# Patient Record
Sex: Male | Born: 1970 | Race: White | Hispanic: No | Smoking: Current every day smoker
Health system: Southern US, Community
[De-identification: ages and names within clinical notes are randomized; demographics above are authoritative.]

## PROBLEM LIST (undated history)

## (undated) DIAGNOSIS — M1990 Unspecified osteoarthritis, unspecified site: Secondary | ICD-10-CM

## (undated) DIAGNOSIS — F419 Anxiety disorder, unspecified: Secondary | ICD-10-CM

## (undated) HISTORY — DX: Anxiety disorder, unspecified: F41.9

---

## 1999-07-05 ENCOUNTER — Emergency Department (HOSPITAL_COMMUNITY): Admission: EM | Admit: 1999-07-05 | Discharge: 1999-07-06 | Payer: Self-pay | Admitting: Emergency Medicine

## 2016-06-01 ENCOUNTER — Ambulatory Visit: Payer: Self-pay

## 2017-04-21 ENCOUNTER — Encounter (HOSPITAL_COMMUNITY): Payer: Self-pay | Admitting: Emergency Medicine

## 2017-04-21 ENCOUNTER — Ambulatory Visit (HOSPITAL_COMMUNITY)
Admission: EM | Admit: 2017-04-21 | Discharge: 2017-04-21 | Disposition: A | Discharge status: Home / Self Care | Payer: Self-pay | Attending: Internal Medicine | Admitting: Internal Medicine

## 2017-04-21 DIAGNOSIS — M130 Polyarthritis, unspecified: Secondary | ICD-10-CM

## 2017-04-21 DIAGNOSIS — M2550 Pain in unspecified joint: Secondary | ICD-10-CM | POA: Insufficient documentation

## 2017-04-21 DIAGNOSIS — F17210 Nicotine dependence, cigarettes, uncomplicated: Secondary | ICD-10-CM | POA: Insufficient documentation

## 2017-04-21 DIAGNOSIS — M1990 Unspecified osteoarthritis, unspecified site: Secondary | ICD-10-CM | POA: Insufficient documentation

## 2017-04-21 HISTORY — DX: Unspecified osteoarthritis, unspecified site: M19.90

## 2017-04-21 LAB — COMPREHENSIVE METABOLIC PANEL
ALT: 20 U/L (ref 17–63)
ANION GAP: 8 (ref 5–15)
AST: 23 U/L (ref 15–41)
Albumin: 4.3 g/dL (ref 3.5–5.0)
Alkaline Phosphatase: 80 U/L (ref 38–126)
BUN: 14 mg/dL (ref 6–20)
CALCIUM: 9.5 mg/dL (ref 8.9–10.3)
CHLORIDE: 98 mmol/L — AB (ref 101–111)
CO2: 28 mmol/L (ref 22–32)
CREATININE: 0.89 mg/dL (ref 0.61–1.24)
Glucose, Bld: 108 mg/dL — ABNORMAL HIGH (ref 65–99)
Potassium: 4 mmol/L (ref 3.5–5.1)
SODIUM: 134 mmol/L — AB (ref 135–145)
Total Bilirubin: 1 mg/dL (ref 0.3–1.2)
Total Protein: 7.9 g/dL (ref 6.5–8.1)

## 2017-04-21 LAB — CBC WITH DIFFERENTIAL/PLATELET
Basophils Absolute: 0 10*3/uL (ref 0.0–0.1)
Basophils Relative: 0 %
EOS ABS: 0.1 10*3/uL (ref 0.0–0.7)
EOS PCT: 1 %
HCT: 41.4 % (ref 39.0–52.0)
Hemoglobin: 14.2 g/dL (ref 13.0–17.0)
LYMPHS ABS: 2.2 10*3/uL (ref 0.7–4.0)
LYMPHS PCT: 26 %
MCH: 32.1 pg (ref 26.0–34.0)
MCHC: 34.3 g/dL (ref 30.0–36.0)
MCV: 93.7 fL (ref 78.0–100.0)
MONO ABS: 0.6 10*3/uL (ref 0.1–1.0)
MONOS PCT: 7 %
Neutro Abs: 5.5 10*3/uL (ref 1.7–7.7)
Neutrophils Relative %: 66 %
PLATELETS: 280 10*3/uL (ref 150–400)
RBC: 4.42 MIL/uL (ref 4.22–5.81)
RDW: 12.5 % (ref 11.5–15.5)
WBC: 8.4 10*3/uL (ref 4.0–10.5)

## 2017-04-21 LAB — SEDIMENTATION RATE: SED RATE: 21 mm/h — AB (ref 0–16)

## 2017-04-21 LAB — C-REACTIVE PROTEIN: CRP: 3.1 mg/dL — AB (ref <1.0)

## 2017-04-21 MED ORDER — METHYLPREDNISOLONE SODIUM SUCC 125 MG IJ SOLR
125.0000 mg | Freq: Once | INTRAMUSCULAR | Status: AC
  Administered 2017-04-21: 125 mg via INTRAMUSCULAR

## 2017-04-21 MED ORDER — PREDNISONE 10 MG PO TABS: 10.0000 mg | ORAL_TABLET | Freq: Every day | ORAL | 0 refills | Status: AC

## 2017-04-21 MED ORDER — METHYLPREDNISOLONE SODIUM SUCC 125 MG IJ SOLR
INTRAMUSCULAR | Status: AC
  Filled 2017-04-21: qty 2

## 2017-04-21 NOTE — ED Triage Notes (Signed)
Pt reports chronic joint pain since June.  Pt has been to several outpatient clinics for this problem. He has been prescribed several different medications for this and has had steroid shots that only last a day or two.  He had xrays done August 10 that shows arthritis in his right wrist and neck.

## 2017-04-21 NOTE — Discharge Instructions (Addendum)
Symmetric joint pain of recent onset primarily involving hips/low back and shoulder/neck suggests a possible rheumatologic problem. Possibilities include polymyalgia rheumatica and rheumatoid arthritis, among others.  Injection of Solu-Medrol (steroid) was given today, along with a prescription for low-dose prednisone. Labs were drawn to assess for some of the more common causes of inflammatory arthritis. A Lyme test is also pending. A primary care provider can help with further evaluation; it sometimes takes a long time to reach a diagnosis.

## 2017-04-21 NOTE — ED Provider Notes (Signed)
[facility]    CSN: [id number] Arrival date & time: 04/21/17  1132     History   Chief Complaint Chief Complaint  Patient presents with  . Joint Pain    HPI Larry Gardner is a 46 y.o. male. He presents today with onset in June of pain in the hips, which has gradually progressed to include pain in the low back, shoulders, and wrists. Some pain in the back of the neck, radiates to the jaw bilaterally. He has morning stiffness that lasts for about 4 hours. Some improvement with taking Naprosyn 500 mg. He is worried about Lyme disease. He was evaluated at [facility] with some lab work, and reports that they found him to be anemic. Family history of possible rheumatoid in the great grandmother, who had hand distortion from arthritis. No rash, no fever, no GI upset. Had always been well previous to the onset of symptoms in June. Currently not able to work, and very frustrated about this.    HPI  Past Medical History:  Diagnosis Date  . Arthritis     History reviewed. No pertinent surgical history.     Home Medications    Prior to Admission medications   Medication Sig Start Date End Date Taking? Authorizing Provider  cyclobenzaprine (FLEXERIL) 10 MG tablet Take 10 mg by mouth 3 (three) times daily as needed for muscle spasms.   Yes [provider]  ibuprofen (ADVIL,MOTRIN) 800 MG tablet Take 800 mg by mouth every 8 (eight) hours as needed.   Yes [provider]  naproxen (NAPROSYN) 500 MG tablet Take 500 mg by mouth 2 (two) times daily with a meal.   Yes [provider]  meloxicam (MOBIC) 15 MG tablet Take 15 mg by mouth daily.    [provider]  predniSONE (DELTASONE) 10 MG tablet Take 1 tablet (10 mg total) by mouth daily with breakfast. 04/21/17 05/05/17  Larry Moore, MD    Family History History reviewed. No pertinent family history.  Social History Social History  Substance Use Topics  . Smoking status:  Current Every Day Smoker    Packs/day: 0.50    Types: Cigarettes  . Smokeless tobacco: Never Used  . Alcohol use 3.6 oz/week    6 Cans of beer per week     Allergies   Patient has no known allergies.   Review of Systems Review of Systems  All other systems reviewed and are negative.    Physical Exam Triage Vital Signs ED Triage Vitals  Enc Vitals Group     BP 04/21/17 1311 (!) 154/91     Pulse Rate 04/21/17 1311 79     Resp --      Temp 04/21/17 1311 98.7 F (37.1 C)     Temp Source 04/21/17 1311 Oral     SpO2 04/21/17 1311 100 %     Weight --      Height --      Pain Score 04/21/17 1312 8     Pain Loc --    Updated Vital Signs BP (!) 154/91 (BP Location: Left Arm)   Pulse 79   Temp 98.7 F (37.1 C) (Oral)   SpO2 100%   Physical Exam  Constitutional: He is oriented to person, place, and time. No distress.  Alert, nicely groomed  HENT:  Head: Atraumatic.  Eyes:  Conjugate gaze, no eye redness/drainage  Neck: Neck supple.  Cardiovascular: Normal rate and regular rhythm.   Pulmonary/Chest: No respiratory distress. He  has no wheezes. He has no rales.  Lungs clear, symmetric breath sounds  Abdominal: He exhibits no distension.  Musculoskeletal: Normal range of motion.  Pain limiting movement in the shoulders and hips, difficult to rise from chair, but is able to rise and walk slowly to the exam table. Some difficulty climbing onto the exam table. Not able to raise arms fully overhead. Mild symmetric swelling of both wrists.  Neurological: He is alert and oriented to person, place, and time.  Skin: Skin is warm and dry.  No cyanosis  Nursing note and vitals reviewed.    UC Treatments / Results  Labs Results for orders placed or performed during the hospital encounter of 04/21/17  CBC with Differential  Result Value Ref Range   WBC 8.4 4.0 - 10.5 K/uL   RBC 4.42 4.22 - 5.81 MIL/uL   Hemoglobin 14.2 13.0 - 17.0 g/dL   HCT 14.7 82.9 - 56.2 %   MCV 93.7  78.0 - 100.0 fL   MCH 32.1 26.0 - 34.0 pg   MCHC 34.3 30.0 - 36.0 g/dL   RDW 13.0 86.5 - 78.4 %   Platelets 280 150 - 400 K/uL   Neutrophils Relative % 66 %   Neutro Abs 5.5 1.7 - 7.7 K/uL   Lymphocytes Relative 26 %   Lymphs Abs 2.2 0.7 - 4.0 K/uL   Monocytes Relative 7 %   Monocytes Absolute 0.6 0.1 - 1.0 K/uL   Eosinophils Relative 1 %   Eosinophils Absolute 0.1 0.0 - 0.7 K/uL   Basophils Relative 0 %   Basophils Absolute 0.0 0.0 - 0.1 K/uL  Comprehensive metabolic panel  Result Value Ref Range   Sodium 134 (L) 135 - 145 mmol/L   Potassium 4.0 3.5 - 5.1 mmol/L   Chloride 98 (L) 101 - 111 mmol/L   CO2 28 22 - 32 mmol/L   Glucose, Bld 108 (H) 65 - 99 mg/dL   BUN 14 6 - 20 mg/dL   Creatinine, Ser 6.96 0.61 - 1.24 mg/dL   Calcium 9.5 8.9 - 29.5 mg/dL   Total Protein 7.9 6.5 - 8.1 g/dL   Albumin 4.3 3.5 - 5.0 g/dL   AST 23 15 - 41 U/L   ALT 20 17 - 63 U/L   Alkaline Phosphatase 80 38 - 126 U/L   Total Bilirubin 1.0 0.3 - 1.2 mg/dL   GFR calc non Af Amer >60 >60 mL/min   GFR calc Af Amer >60 >60 mL/min   Anion gap 8 5 - 15  Sedimentation rate  Result Value Ref Range   Sed Rate 21 (H) 0 - 16 mm/hr  C-reactive protein  Result Value Ref Range   CRP 3.1 (H) <1.0 mg/dL    EKG  EKG Interpretation None       Procedures Procedures (including critical care time)  Medications Ordered in UC Medications  methylPREDNISolone sodium succinate (SOLU-MEDROL) 125 mg/2 mL injection 125 mg (125 mg Intramuscular Given 04/21/17 1411)     Final Clinical Impressions(s) / UC Diagnoses   Final diagnoses:  Polyarthropathy or polyarthritis of multiple sites   Symmetric joint pain of recent onset primarily involving hips/low back and shoulder/neck suggests a possible rheumatologic problem. Possibilities include polymyalgia rheumatica and rheumatoid arthritis, among others.  Injection of Solu-Medrol (steroid) was given today, along with a prescription for low-dose prednisone. Labs were  drawn to assess for some of the more common causes of inflammatory arthritis. A Lyme test is also pending. A primary care provider  can help with further evaluation; it sometimes takes a long time to reach a diagnosis.   New Prescriptions New Prescriptions   PREDNISONE (DELTASONE) 10 MG TABLET    Take 1 tablet (10 mg total) by mouth daily with breakfast.     Controlled Substance Prescriptions [place] Controlled Substance Registry consulted? No   Larry Moore, MD 04/21/17 2102

## 2017-04-22 LAB — RHEUMATOID FACTOR

## 2017-04-22 LAB — B. BURGDORFI ANTIBODIES: B burgdorferi Ab IgG+IgM: <0.91 {ISR} (ref 0.00–0.90)

## 2017-04-24 LAB — ANTINUCLEAR ANTIBODIES, IFA: ANTINUCLEAR ANTIBODIES, IFA: NEGATIVE

## 2017-08-15 ENCOUNTER — Ambulatory Visit: Payer: Self-pay | Admitting: Internal Medicine

## 2017-08-17 ENCOUNTER — Encounter: Payer: Self-pay | Admitting: Internal Medicine

## 2017-08-17 ENCOUNTER — Ambulatory Visit: Payer: Self-pay | Attending: Internal Medicine | Admitting: Internal Medicine

## 2017-08-17 VITALS — BP 133/85 | HR 66 | Temp 98.0°F | Resp 16 | Ht 68.0 in | Wt 177.6 lb

## 2017-08-17 DIAGNOSIS — F17210 Nicotine dependence, cigarettes, uncomplicated: Secondary | ICD-10-CM | POA: Insufficient documentation

## 2017-08-17 DIAGNOSIS — M2550 Pain in unspecified joint: Secondary | ICD-10-CM

## 2017-08-17 DIAGNOSIS — F419 Anxiety disorder, unspecified: Secondary | ICD-10-CM

## 2017-08-17 DIAGNOSIS — Z79899 Other long term (current) drug therapy: Secondary | ICD-10-CM | POA: Insufficient documentation

## 2017-08-17 DIAGNOSIS — F17200 Nicotine dependence, unspecified, uncomplicated: Secondary | ICD-10-CM

## 2017-08-17 DIAGNOSIS — Z8249 Family history of ischemic heart disease and other diseases of the circulatory system: Secondary | ICD-10-CM | POA: Insufficient documentation

## 2017-08-17 DIAGNOSIS — Z131 Encounter for screening for diabetes mellitus: Secondary | ICD-10-CM

## 2017-08-17 MED ORDER — DULOXETINE HCL 30 MG PO CPEP: 30.0000 mg | ORAL_CAPSULE | Freq: Every day | ORAL | 3 refills | Status: DC

## 2017-08-17 MED ORDER — MELOXICAM 15 MG PO TABS: 15.0000 mg | ORAL_TABLET | Freq: Every day | ORAL | 3 refills | Status: DC

## 2017-08-17 NOTE — Patient Instructions (Signed)
Please give patient for for [employer] and [employer]	[facility].   Please sign a release for use to get [facility].   Take Mobic and Cymbalta as discussed.  Stop Ibuprofen and Aleve.
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
## 2017-08-19 LAB — TSH: TSH: 2.36 u[IU]/mL (ref 0.450–4.500)

## 2017-08-19 LAB — ALDOLASE: Aldolase: 4.2 U/L (ref 3.3–10.3)

## 2017-08-19 LAB — CYCLIC CITRUL PEPTIDE ANTIBODY, IGG/IGA: Cyclic Citrullin Peptide Ab: 6 units (ref 0–19)

## 2017-08-19 LAB — HEMOGLOBIN A1C
Est. average glucose Bld gHb Est-mCnc: 120 mg/dL
HEMOGLOBIN A1C: 5.8 % — AB (ref 4.8–5.6)

## 2017-08-19 LAB — RHEUMATOID FACTOR: Rhuematoid fact SerPl-aCnc: <10 IU/mL (ref 0.0–13.9)

## 2017-08-19 LAB — CK: Total CK: 62 U/L (ref 24–204)

## 2017-08-22 ENCOUNTER — Telehealth: Payer: Self-pay

## 2017-08-22 NOTE — Telephone Encounter (Signed)
Contacted pt to go over lab results pt is aware and doesn't have any questions or concerns 

## 2017-08-30 ENCOUNTER — Ambulatory Visit: Payer: Self-pay

## 2017-09-28 ENCOUNTER — Ambulatory Visit: Payer: Self-pay | Admitting: Internal Medicine

## 2017-10-24 DIAGNOSIS — M2550 Pain in unspecified joint: Secondary | ICD-10-CM

## 2020-06-30 ENCOUNTER — Encounter (HOSPITAL_COMMUNITY): Payer: Self-pay

## 2020-06-30 ENCOUNTER — Emergency Department (HOSPITAL_COMMUNITY): Payer: Self-pay

## 2020-06-30 ENCOUNTER — Other Ambulatory Visit: Payer: Self-pay

## 2020-06-30 ENCOUNTER — Emergency Department (HOSPITAL_COMMUNITY)
Admission: EM | Admit: 2020-06-30 | Discharge: 2020-07-01 | Disposition: A | Discharge status: Home / Self Care | Payer: Self-pay | Attending: Emergency Medicine | Admitting: Emergency Medicine

## 2020-06-30 DIAGNOSIS — R0789 Other chest pain: Secondary | ICD-10-CM | POA: Insufficient documentation

## 2020-06-30 DIAGNOSIS — R079 Chest pain, unspecified: Secondary | ICD-10-CM

## 2020-06-30 DIAGNOSIS — R1013 Epigastric pain: Secondary | ICD-10-CM | POA: Insufficient documentation

## 2020-06-30 DIAGNOSIS — R059 Cough, unspecified: Secondary | ICD-10-CM | POA: Insufficient documentation

## 2020-06-30 DIAGNOSIS — F17210 Nicotine dependence, cigarettes, uncomplicated: Secondary | ICD-10-CM | POA: Insufficient documentation

## 2020-06-30 LAB — CBC
HCT: 45.1 % (ref 39.0–52.0)
Hemoglobin: 15.8 g/dL (ref 13.0–17.0)
MCH: 33.8 pg (ref 26.0–34.0)
MCHC: 35 g/dL (ref 30.0–36.0)
MCV: 96.6 fL (ref 80.0–100.0)
Platelets: 184 10*3/uL (ref 150–400)
RBC: 4.67 MIL/uL (ref 4.22–5.81)
RDW: 12 % (ref 11.5–15.5)
WBC: 6.7 10*3/uL (ref 4.0–10.5)
nRBC: 0 % (ref 0.0–0.2)

## 2020-06-30 LAB — BASIC METABOLIC PANEL
Anion gap: 9 (ref 5–15)
BUN: 17 mg/dL (ref 6–20)
CO2: 29 mmol/L (ref 22–32)
Calcium: 9.2 mg/dL (ref 8.9–10.3)
Chloride: 99 mmol/L (ref 98–111)
Creatinine, Ser: 0.92 mg/dL (ref 0.61–1.24)
GFR, Estimated: >60 mL/min (ref >60)
Glucose, Bld: 116 mg/dL — ABNORMAL HIGH (ref 70–99)
Potassium: 4.3 mmol/L (ref 3.5–5.1)
Sodium: 137 mmol/L (ref 135–145)

## 2020-06-30 LAB — HEPATIC FUNCTION PANEL
ALT: 34 U/L (ref 0–44)
AST: 27 U/L (ref 15–41)
Albumin: 4.6 g/dL (ref 3.5–5.0)
Alkaline Phosphatase: 76 U/L (ref 38–126)
Bilirubin, Direct: <0.1 mg/dL (ref 0.0–0.2)
Total Bilirubin: 0.5 mg/dL (ref 0.3–1.2)
Total Protein: 7.6 g/dL (ref 6.5–8.1)

## 2020-06-30 LAB — LIPASE, BLOOD: Lipase: 30 U/L (ref 11–51)

## 2020-06-30 LAB — TROPONIN I (HIGH SENSITIVITY): Troponin I (High Sensitivity): 4 ng/L (ref <18)

## 2020-06-30 MED ORDER — LIDOCAINE VISCOUS HCL 2 % MT SOLN
15.0000 mL | Freq: Once | OROMUCOSAL | Status: AC
  Administered 2020-07-01: 15 mL via ORAL
  Filled 2020-06-30: qty 15

## 2020-06-30 MED ORDER — ALUM & MAG HYDROXIDE-SIMETH 200-200-20 MG/5ML PO SUSP
30.0000 mL | Freq: Once | ORAL | Status: AC
  Administered 2020-07-01: 30 mL via ORAL
  Filled 2020-06-30: qty 30

## 2020-06-30 MED ORDER — MORPHINE SULFATE (PF) 4 MG/ML IV SOLN
4.0000 mg | Freq: Once | INTRAVENOUS | Status: AC
  Administered 2020-06-30: 4 mg via INTRAVENOUS
  Filled 2020-06-30: qty 1

## 2020-06-30 MED ORDER — IOHEXOL 350 MG/ML SOLN
100.0000 mL | Freq: Once | INTRAVENOUS | Status: AC | PRN
  Administered 2020-06-30: 100 mL via INTRAVENOUS

## 2020-06-30 MED ORDER — ONDANSETRON HCL 4 MG/2ML IJ SOLN
4.0000 mg | Freq: Once | INTRAMUSCULAR | Status: AC
  Administered 2020-06-30: 4 mg via INTRAVENOUS
  Filled 2020-06-30: qty 2

## 2020-06-30 MED ORDER — PANTOPRAZOLE SODIUM 40 MG IV SOLR
40.0000 mg | Freq: Once | INTRAVENOUS | Status: AC
  Administered 2020-07-01: 40 mg via INTRAVENOUS
  Filled 2020-06-30: qty 40

## 2020-06-30 NOTE — ED Triage Notes (Signed)
Patient arrives with complaints of central chest pain that started about 30 minutes ago. Reporting one episode of vomiting. Declines any dizziness.

## 2020-06-30 NOTE — ED Provider Notes (Signed)
[facility]-EMERGENCY DEPT Provider Note   CSN: [id number] Arrival date & time: 06/30/20  2133     History Chief Complaint  Patient presents with  . Chest Pain    Larry Gardner is a 49 y.o. male.  Larry Gardner is a 49 y.o. male patient with history of polymyalgia rheumatica, arthritis, anxiety, tobacco abuse, who presents to the emergency department for evaluation of chest pain.  He states that this chest pain began suddenly about 30 minutes prior to arrival.  He was sitting at his house eating some canned tuna, he took a small bite of this and then had sudden onset of this pain in the lower center of his chest.  He has a hard time describing the pain says it is a constant aching pain he says sometimes it feels sharp and other times it feels like pressure.  Pain does not seem to be radiating, but he does state that it feels like he has some pain in his upper abdomen as well.  No pain in the arm neck or jaw.  He has never had pain like this, and pain has not gone away, he states that sometimes it seems to ease off but never completely resolves.  He denies associated shortness of breath.  He states when it first occurred he became diaphoretic.  He went outside and was coughing after the pain started and states he spit up a little phlegm but otherwise has not been coughing.  Denies vomiting.  No lower extremity pain or swelling.  No prior history of heart problems, high blood pressure, diabetes.  Patient does have a family history of hypertension and hyperlipidemia.  He currently smokes about half pack per day.  Initially patient denied drug use but later states that he does intermittently use cocaine which he most recently used on Saturday but he does not feel that this could be in any way related.  Reports he drinks intermittently and did have 2 glasses of wine earlier today.        Past Medical History:  Diagnosis Date  . Anxiety   . Arthritis     Patient  Active Problem List   Diagnosis Date Noted  . Polyarthralgia 08/17/2017  . Anxiety disorder 08/17/2017  . Tobacco use disorder 08/17/2017    History reviewed. No pertinent surgical history.     Family History  Problem Relation Age of Onset  . Hyperlipidemia Mother   . Hypertension Father   . Hyperlipidemia Father     Social History   Tobacco Use  . Smoking status: Current Every Day Smoker    Packs/day: 0.25    Types: Cigarettes  . Smokeless tobacco: Never Used  Substance Use Topics  . Alcohol use: Yes    Alcohol/week: 6.0 standard drinks    Types: 6 Cans of beer per week  . Drug use: No    Home Medications Prior to Admission medications   Medication Sig Start Date End Date Taking? Authorizing Provider  ibuprofen (ADVIL) 200 MG tablet Take 200-400 mg by mouth every 6 (six) hours as needed for moderate pain.   Yes [provider]  DULoxetine (CYMBALTA) 30 MG capsule Take 1 capsule (30 mg total) by mouth daily. Patient not taking: Reported on 06/30/2020 08/17/17   Marcine Matar, MD  meloxicam (MOBIC) 15 MG tablet Take 1 tablet (15 mg total) by mouth daily. Patient not taking: Reported on 06/30/2020 08/17/17   Marcine Matar, MD  omeprazole (PRILOSEC) 40 MG capsule  Take 1 capsule (40 mg total) by mouth 2 (two) times daily before a meal. 07/01/20   Dartha Lodge, PA-C  sucralfate (CARAFATE) 1 GM/10ML suspension Take 10 mLs (1 g total) by mouth 4 (four) times daily -  with meals and at bedtime. 07/01/20   Dartha Lodge, PA-C    Allergies    Patient has no known allergies.  Review of Systems   Review of Systems  Constitutional: Positive for diaphoresis. Negative for chills and fever.  HENT: Negative.   Respiratory: Negative for cough and shortness of breath.   Cardiovascular: Positive for chest pain. Negative for palpitations and leg swelling.  Gastrointestinal: Negative for abdominal distention, abdominal pain, diarrhea, nausea and vomiting.    Genitourinary: Negative for dysuria and frequency.  Musculoskeletal: Negative for arthralgias, back pain and joint swelling.  Skin: Negative for color change and rash.  Neurological: Negative for dizziness, syncope, weakness, light-headedness, numbness and headaches.  All other systems reviewed and are negative.   Physical Exam Updated Vital Signs BP (!) 141/103 (BP Location: Right Arm)   Pulse 86   Temp 98 F (36.7 C) (Oral)   Resp 17   SpO2 98%   Physical Exam Vitals and nursing note reviewed.  Constitutional:      General: He is not in acute distress.    Appearance: He is well-developed. He is not diaphoretic.     Comments: Patient appears uncomfortable, but is in no acute distress  HENT:     Head: Normocephalic and atraumatic.  Eyes:     General:        Right eye: No discharge.        Left eye: No discharge.  Cardiovascular:     Rate and Rhythm: Normal rate and regular rhythm.     Pulses:          Radial pulses are 2+ on the right side and 2+ on the left side.       Dorsalis pedis pulses are 2+ on the right side and 2+ on the left side.     Heart sounds: Normal heart sounds. No murmur heard.  No friction rub. No gallop.   Pulmonary:     Effort: Pulmonary effort is normal. No respiratory distress.     Breath sounds: Normal breath sounds. No wheezing or rales.     Comments: Respirations equal and unlabored, patient able to speak in full sentences, lungs clear to auscultation bilaterally Chest:     Chest wall: No tenderness.     Comments: Pain is not reproducible with palpation Abdominal:     General: Bowel sounds are normal. There is no distension.     Palpations: Abdomen is soft. There is no mass.     Tenderness: There is no abdominal tenderness. There is no guarding.     Comments: Abdomen is soft, nondistended, bowel sounds present throughout, patient does endorse some pain in the epigastric region coming from the lower chest but this is not made worse with  palpation, all other quadrants nontender to palpation, no guarding or rebound tenderness  Musculoskeletal:        General: No deformity.     Cervical back: Neck supple.     Right lower leg: No tenderness. No edema.     Left lower leg: No tenderness. No edema.  Skin:    General: Skin is warm and dry.     Capillary Refill: Capillary refill takes less than 2 seconds.  Neurological:     Mental  Status: He is alert.     Coordination: Coordination normal.     Comments: Speech is clear, able to follow commands Moves extremities without ataxia, coordination intact  Psychiatric:        Mood and Affect: Mood normal.        Behavior: Behavior normal.     ED Results / Procedures / Treatments   Labs (all labs ordered are listed, but only abnormal results are displayed) Labs Reviewed  BASIC METABOLIC PANEL - Abnormal; Notable for the following components:      Result Value   Glucose, Bld 116 (*)    All other components within normal limits  RAPID URINE DRUG SCREEN, HOSP PERFORMED - Abnormal; Notable for the following components:   Cocaine POSITIVE (*)    All other components within normal limits  CBC  HEPATIC FUNCTION PANEL  LIPASE, BLOOD  TROPONIN I (HIGH SENSITIVITY)  TROPONIN I (HIGH SENSITIVITY)    EKG EKG Interpretation  Date/Time:  Tuesday June 30 2020 21:49:06 EST Ventricular Rate:  91 PR Interval:    QRS Duration: 102 QT Interval:  367 QTC Calculation: 452 R Axis:   71 Text Interpretation: Sinus rhythm Biatrial enlargement Probable anterolateral infarct, old Baseline wander in lead(s) V2 Confirmed by Tilden Fossa [phone]) on 06/30/2020 10:27:26 PM   Radiology DG Chest 2 View  Result Date: 06/30/2020 CLINICAL DATA:  Central chest pain for 30 minutes EXAM: CHEST - 2 VIEW COMPARISON:  None. FINDINGS: The heart size and mediastinal contours are within normal limits. Both lungs are clear. The visualized skeletal structures are unremarkable. IMPRESSION: No active  cardiopulmonary disease. Electronically Signed   By: Sharlet Salina M.D.   On: 06/30/2020 22:05   CT Angio Chest/Abd/Pel for Dissection W and/or Wo Contrast  Result Date: 06/30/2020 CLINICAL DATA:  Central chest pain for 30 minutes, vomiting EXAM: CT ANGIOGRAPHY CHEST, ABDOMEN AND PELVIS TECHNIQUE: Non-contrast CT of the chest was initially obtained. Multidetector CT imaging through the chest, abdomen and pelvis was performed using the standard protocol during bolus administration of intravenous contrast. Multiplanar reconstructed images and MIPs were obtained and reviewed to evaluate the vascular anatomy. CONTRAST:  OMNIPAQUE IOHEXOL 350 MG/ML SOLN COMPARISON:  None. FINDINGS: CTA CHEST FINDINGS Cardiovascular: Thoracic aorta is unremarkable without aneurysm or dissection. The heart is unremarkable without pericardial effusion. Minimal atherosclerosis within the coronary vasculature. While not optimized for opacification of the pulmonary vasculature, there is sufficient contrast enhancement to exclude pulmonary emboli. Mediastinum/Nodes: No enlarged mediastinal, hilar, or axillary lymph nodes. Thyroid gland, trachea, and esophagus demonstrate no significant findings. Lungs/Pleura: Mild upper lobe predominant emphysema. No airspace disease, effusion, or pneumothorax. Musculoskeletal: No acute or destructive bony lesions. Reconstructed images demonstrate no additional findings. Review of the MIP images confirms the above findings. CTA ABDOMEN AND PELVIS FINDINGS VASCULAR Aorta: Normal caliber aorta without aneurysm, dissection, vasculitis or significant stenosis. Scattered atherosclerosis of the distal aorta. Celiac: Patent without evidence of aneurysm, dissection, vasculitis or significant stenosis. SMA: Patent without evidence of aneurysm, dissection, vasculitis or significant stenosis. Renals: Both renal arteries are patent without evidence of aneurysm, dissection, vasculitis, fibromuscular dysplasia or  significant stenosis. There are bilateral accessory renal arteries, supplying the upper pole the right kidney in the lower pole the left kidney. These vessels are also widely patent. IMA: Patent without evidence of aneurysm, dissection, vasculitis or significant stenosis. Inflow: Patent without evidence of aneurysm, dissection, vasculitis or significant stenosis. Veins: No obvious venous abnormality within the limitations of this arterial phase study. Review of  the MIP images confirms the above findings. NON-VASCULAR Hepatobiliary: There is diffuse hepatic steatosis. Gallbladder is decompressed but otherwise unremarkable. Pancreas: Unremarkable. No pancreatic ductal dilatation or surrounding inflammatory changes. Spleen: Normal in size without focal abnormality. Adrenals/Urinary Tract: Adrenal glands are unremarkable. Kidneys are normal, without renal calculi, focal lesion, or hydronephrosis. Bladder is unremarkable. Stomach/Bowel: No bowel obstruction or ileus. No wall thickening or inflammatory change. Normal appendix right lower quadrant. Lymphatic: No pathologic adenopathy. Reproductive: Prostate is unremarkable. Other: No free fluid or free gas. No abdominal wall hernia. Musculoskeletal: No acute or destructive bony lesions. Reconstructed images demonstrate no additional findings. Review of the MIP images confirms the above findings. IMPRESSION: 1. No evidence of thoracic or abdominal aortic aneurysm or dissection. 2. No evidence of pulmonary embolism. 3. Diffuse hepatic steatosis. 4. Aortic Atherosclerosis (ICD10-I70.0) and Emphysema (ICD10-J43.9). Electronically Signed   By: Sharlet Salina M.D.   On: 06/30/2020 23:27    Procedures Procedures (including critical care time)  Medications Ordered in ED Medications  sucralfate (CARAFATE) 1 GM/10ML suspension 1 g (has no administration in time range)  ondansetron (ZOFRAN) injection 4 mg (4 mg Intravenous Given 06/30/20 2328)  morphine 4 MG/ML injection 4  mg (4 mg Intravenous Given 06/30/20 2329)  iohexol (OMNIPAQUE) 350 MG/ML injection 100 mL (100 mLs Intravenous Contrast Given 06/30/20 2304)  pantoprazole (PROTONIX) injection 40 mg (40 mg Intravenous Given 07/01/20 0033)  alum & mag hydroxide-simeth (MAALOX/MYLANTA) 200-200-20 MG/5ML suspension 30 mL (30 mLs Oral Given 07/01/20 0034)    And  lidocaine (XYLOCAINE) 2 % viscous mouth solution 15 mL (15 mLs Oral Given 07/01/20 0034)    ED Course  I have reviewed the triage vital signs and the nursing notes.  Pertinent labs & imaging results that were available during my care of the patient were reviewed by me and considered in my medical decision making (see chart for details).    MDM Rules/Calculators/A&P                          Patient presents to the emergency department with severe sudden onset pain to the central lower chest, with some pain in the epigastric region as well. Patient nontoxic appearing, in no apparent distress, vitals without significant abnormality.  Pain is not reproducible with palpation on exam.  Patient does report alcohol use earlier today and history of cocaine use most recently on Saturday.  No prior cardiac history.   DDX: ACS, pulmonary embolism, dissection, pneumothorax, pneumonia, arrhythmia, severe anemia, MSK, GERD, pancreatitis, cholecystitis, anxiety. Evaluation initiated with labs, EKG, and CXR. Patient on cardiac monitor.   CBC: No leukocytosis, normal hemoglobin BMP: Glucose of 116 but no other electrolyte derangements, normal renal function LFTs: WNL Lipase: WNL Troponin: Negative x2 EKG: Sinus rhythm without acute ischemic changes. CXR:  Negative, without infiltrate, effusion, pneumothorax, or fracture/dislocation.   Given patient's sudden onset of pain that is severe causing some diaphoresis, and that pain has been unrelenting, history of cocaine abuse puts him at risk for aortic dissection, will get dissection study.  Fortunately patient's CT  shows no evidence of dissection or aortic aneurysm, no evidence of PE, some diffuse hepatic steatosis noted but no other acute abnormalities noted within the chest, abdomen or pelvis.  No signs of cholecystitis or pancreatic inflammation.  Patient has had reassuring work-up and feel that symptoms are very atypical for ACS.  High clinical suspicion for GERD, gastritis or gastric ulcer.  He did not get much improvement with  morphine and Zofran given for pain, but was given GI cocktail and IV Protonix and did get relief.  Will start patient on twice daily omeprazole and prescribed Carafate, he will need to follow-up with GI, may need endoscopy.  Is not having any melena or hematochezia and has not been vomiting.  At this time there does not appear to be any evidence of an acute emergency medical condition and the patient appears stable for discharge with appropriate outpatient follow up.Diagnosis was discussed with patient who verbalizes understanding and is agreeable to discharge.   Final Clinical Impression(s) / ED Diagnoses Final diagnoses:  Central chest pain  Epigastric pain    Rx / DC Orders ED Discharge Orders         Ordered    sucralfate (CARAFATE) 1 GM/10ML suspension  3 times daily with meals & bedtime        07/01/20 0142    omeprazole (PRILOSEC) 40 MG capsule  2 times daily before meals        07/01/20 0142           Dartha LodgeFord, Riad Wagley N, PA-C 07/01/20 0150    Tilden Fossaees, Elizabeth, MD 07/01/20 [id number]

## 2020-07-01 LAB — RAPID URINE DRUG SCREEN, HOSP PERFORMED
Amphetamines: NOT DETECTED
Barbiturates: NOT DETECTED
Benzodiazepines: NOT DETECTED
Cocaine: POSITIVE — AB
Opiates: NOT DETECTED
Tetrahydrocannabinol: NOT DETECTED

## 2020-07-01 LAB — TROPONIN I (HIGH SENSITIVITY): Troponin I (High Sensitivity): <2 ng/L (ref <18)

## 2020-07-01 MED ORDER — SUCRALFATE 1 GM/10ML PO SUSP
1.0000 g | Freq: Once | ORAL | Status: AC
  Administered 2020-07-01: 1 g via ORAL
  Filled 2020-07-01: qty 10

## 2020-07-01 MED ORDER — SUCRALFATE 1 GM/10ML PO SUSP: 1.0000 g | Freq: Three times a day (TID) | ORAL | 0 refills | Status: DC

## 2020-07-01 MED ORDER — OMEPRAZOLE 40 MG PO CPDR: 40.0000 mg | DELAYED_RELEASE_CAPSULE | Freq: Two times a day (BID) | ORAL | 0 refills | Status: DC

## 2020-07-01 NOTE — Discharge Instructions (Addendum)
Your evaluation today has been reassuring, does not suggest a problem with your heart or lungs.  Suspect pain may be due to gastritis, and ulcer or acid reflux.  Please begin taking omeprazole twice daily before breakfast and dinner, use Carafate before meals and at bedtime to help coat and protect your stomach.  You can use Maalox for breakthrough symptoms.  Avoid alcohol, or NSAIDs such as aspirin, ibuprofen or Aleve.  Use the information provided on food changes to help reduce symptoms.  I would like for you to call to schedule follow-up with GI so that they can further evaluate this pain.  If you develop worsening pain, fevers, vomiting, blood in your vomit or stool or any other new or concerning symptoms return for reevaluation.

## 2021-09-08 IMAGING — CR DG CHEST 2V
2 series · 2 of 2 positions shown · non-contrast
Comparison: None.

CLINICAL DATA: Central chest pain for 30 minutes

EXAM:
CHEST - 2 VIEW

[w chest pa]
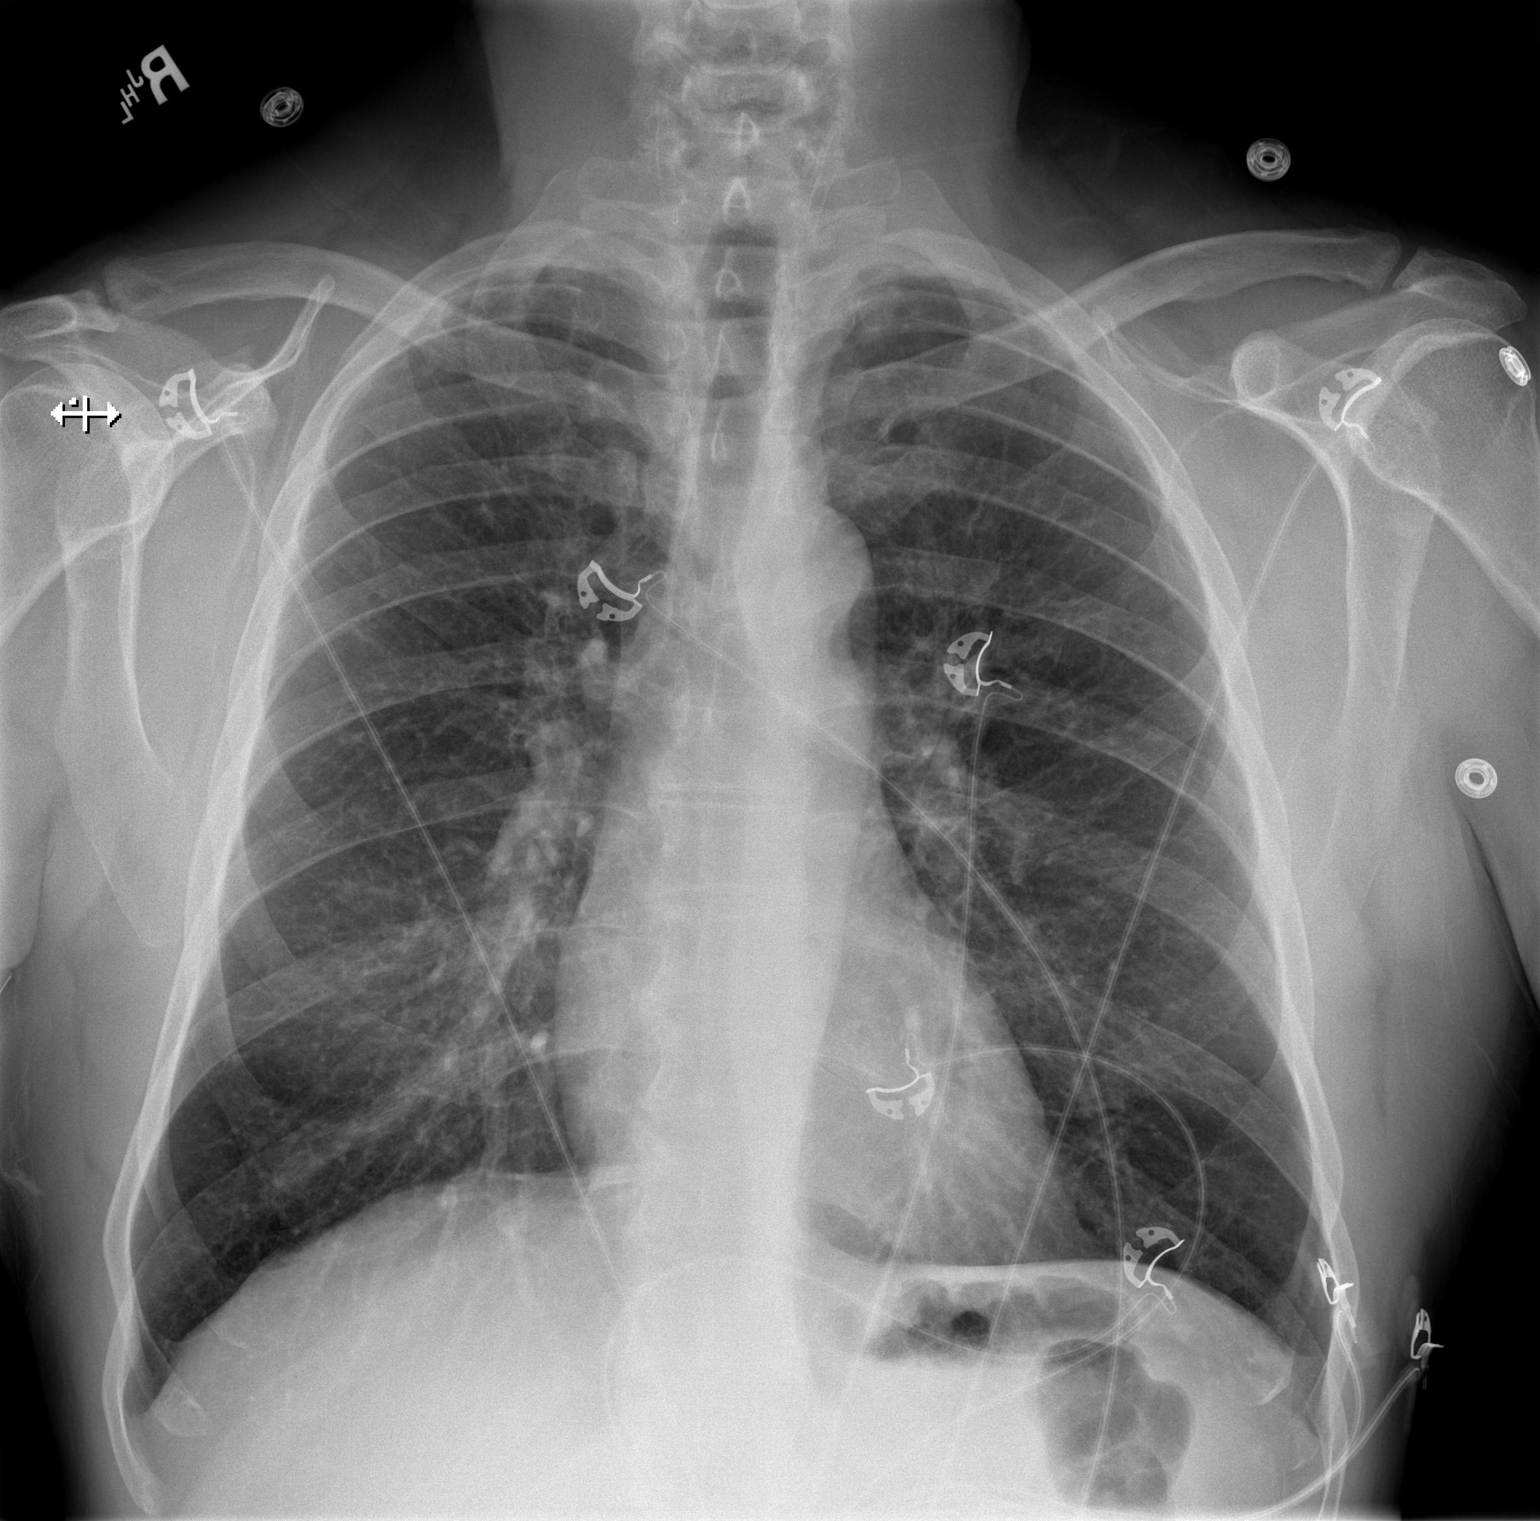

[w chest lat]
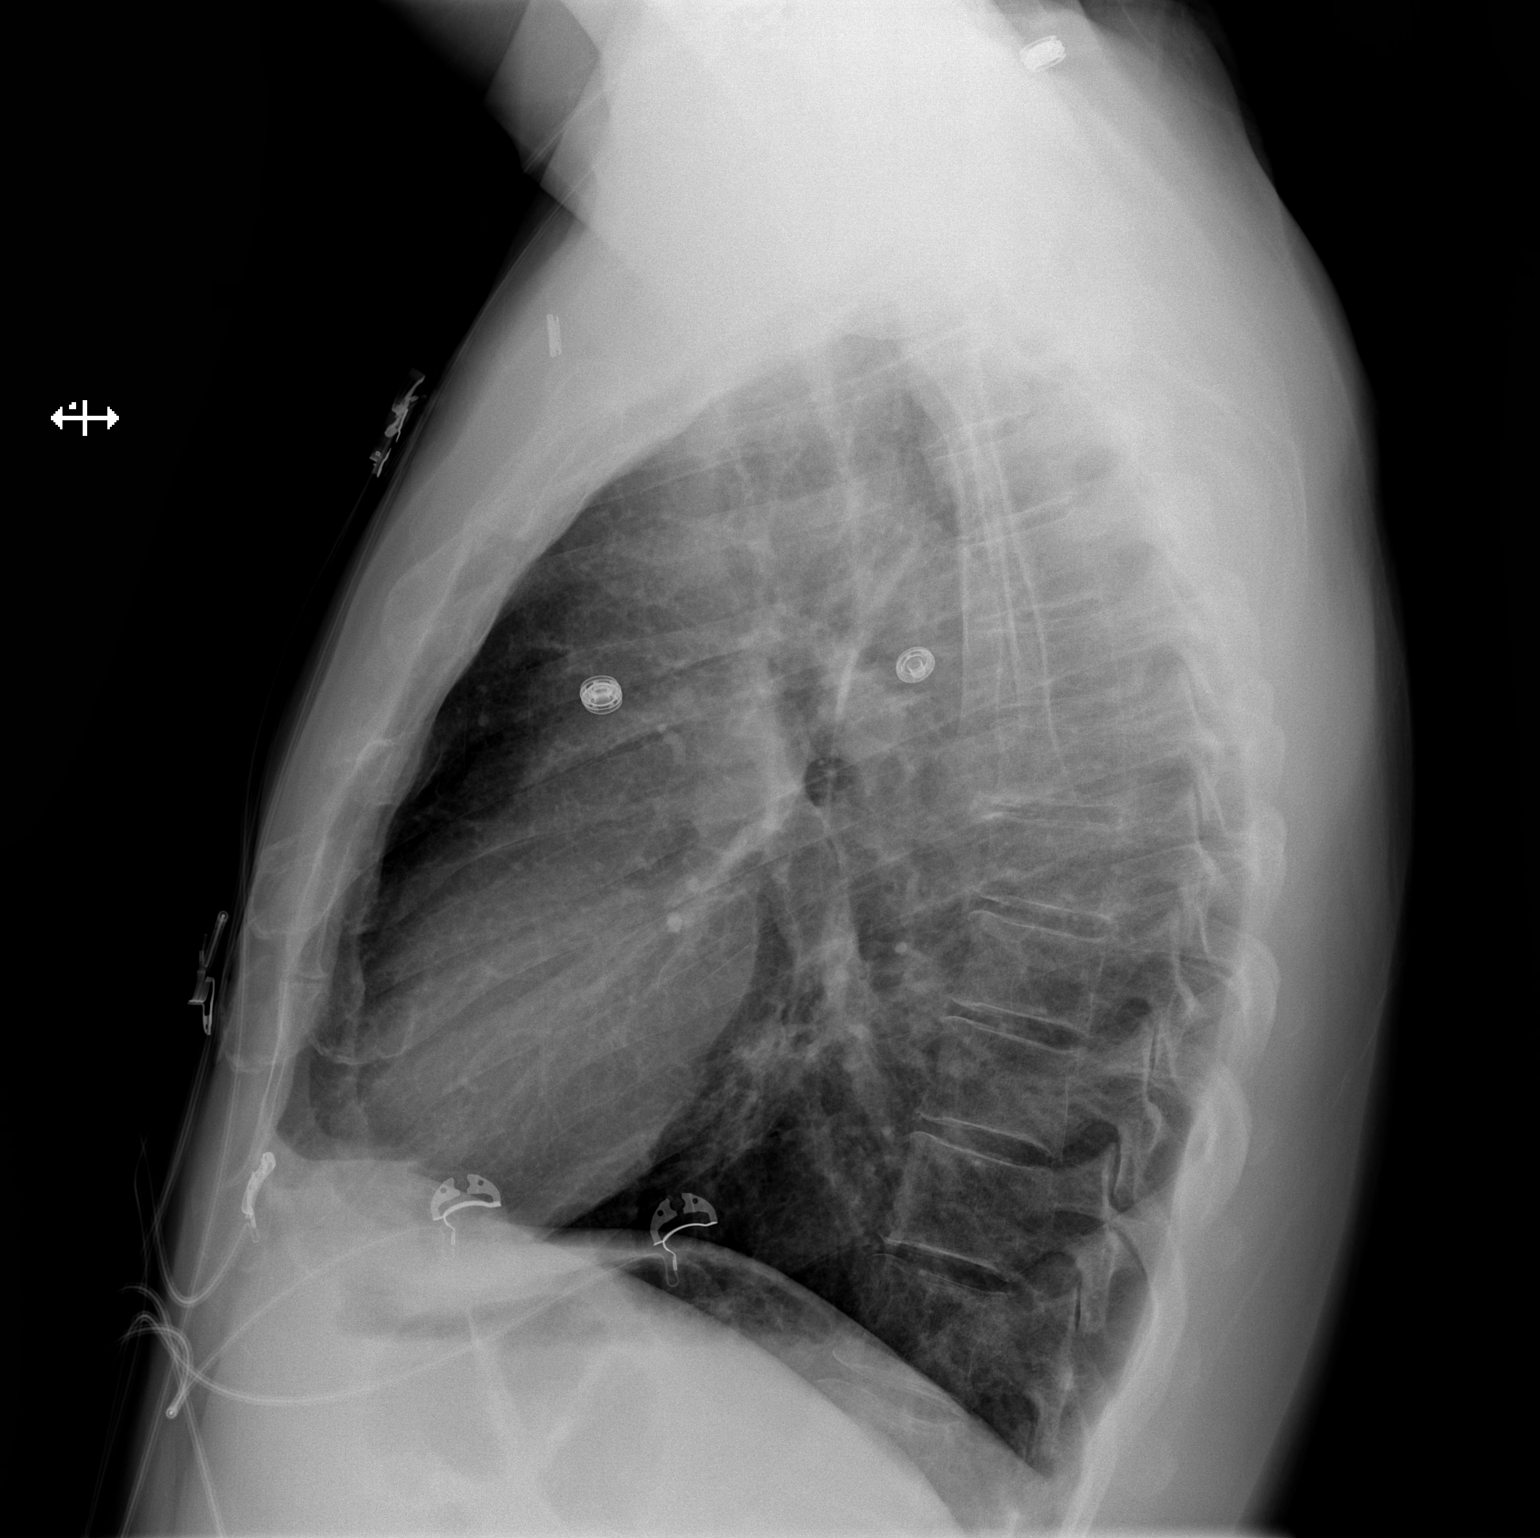

[2 of 2 positions shown; findings below may reference images not displayed]

FINDINGS: The heart size and mediastinal contours are within normal limits.
Both lungs are clear. The visualized skeletal structures are
unremarkable.
IMPRESSION: No active cardiopulmonary disease.

## 2021-09-08 IMAGING — CT CT ANGIO CHEST-ABD-PELV FOR DISSECTION W/ AND WO/W CM
2 of 7 series · 13 of 46 positions shown, 15 images · non-contrast
Comparison: None.

CLINICAL DATA: Central chest pain for 30 minutes, vomiting

EXAM:
CT ANGIOGRAPHY CHEST, ABDOMEN AND PELVIS
TECHNIQUE: Non-contrast CT of the chest was initially obtained.

[Series 4: axial arterial · axial · arterial · 0.86mm/px · z∈[+993,+1563]mm · 10 of 220 slices shown, 12 images]
[im 15/220  soft-tissue]
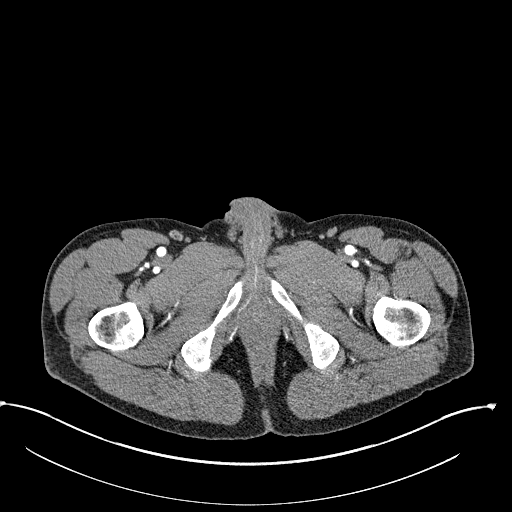
[im 15/220  bone]
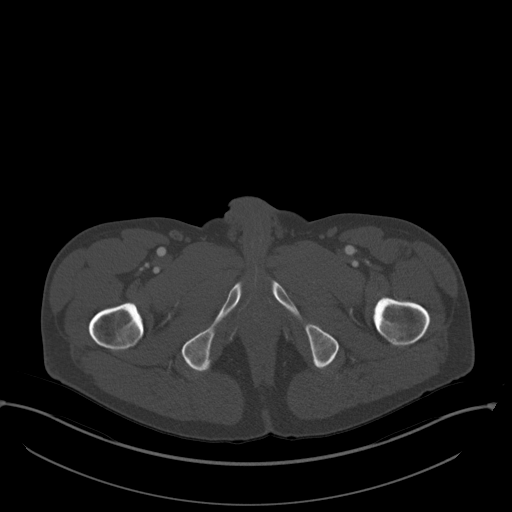
[im 44/220  soft-tissue]
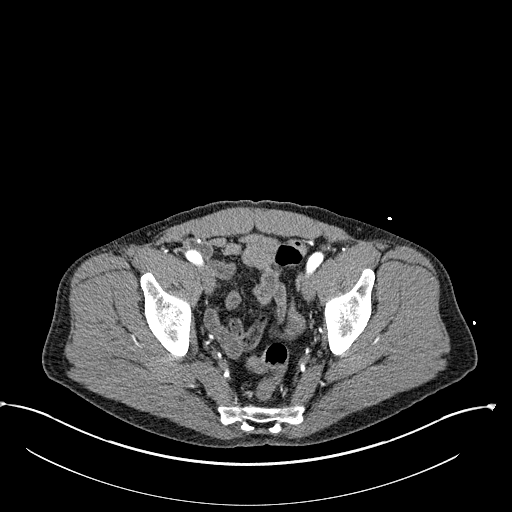
[im 59/220  soft-tissue]
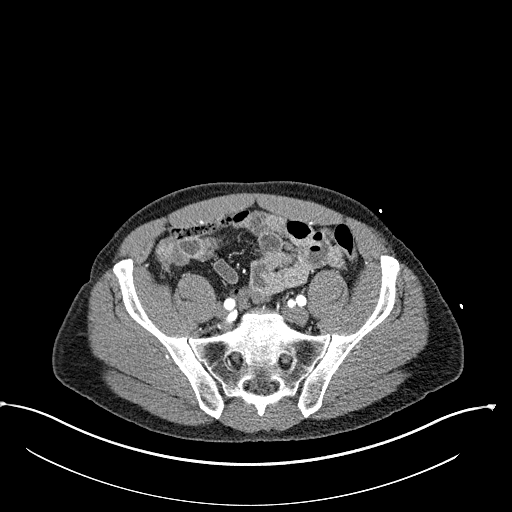
[im 74/220  soft-tissue]
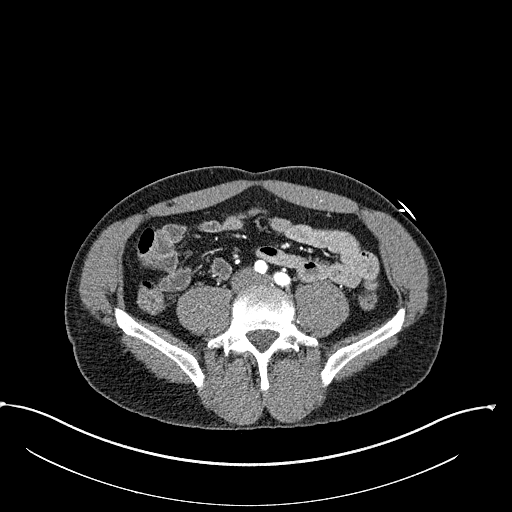
[im 103/220  soft-tissue]
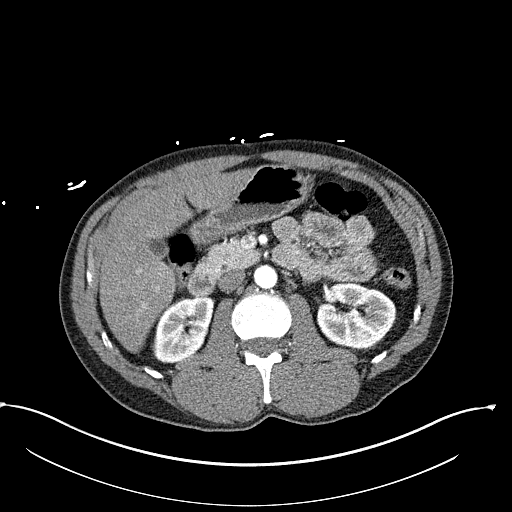
[im 117/220  soft-tissue]
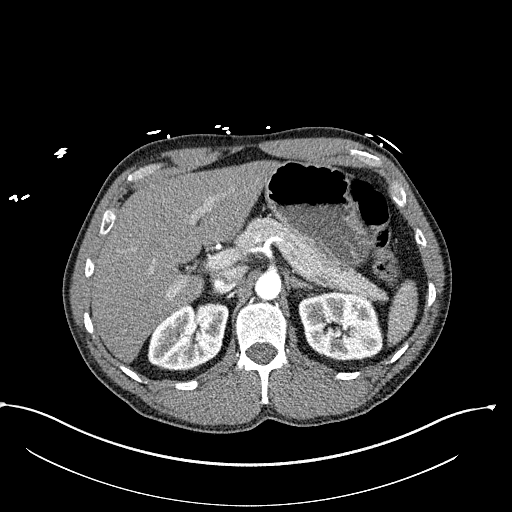
[im 147/220  soft-tissue]
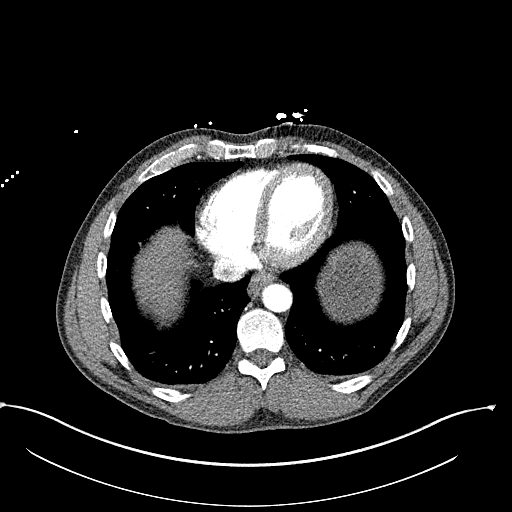
[im 161/220  soft-tissue]
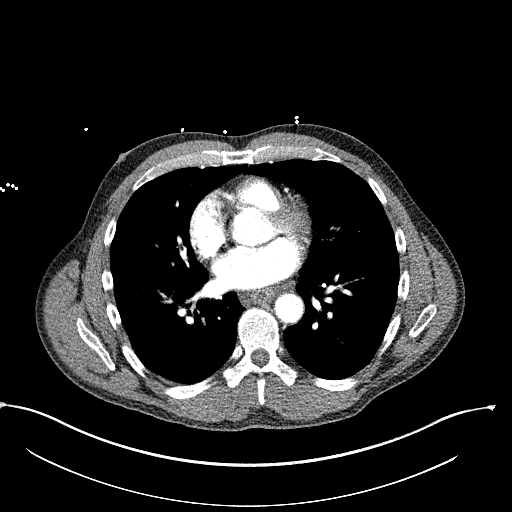
[im 176/220  soft-tissue]
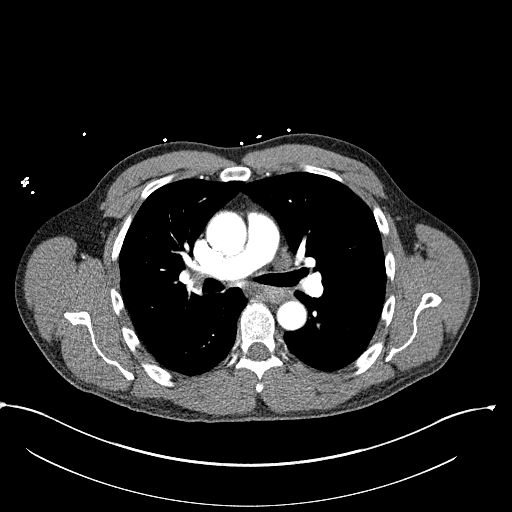
[im 176/220  bone]
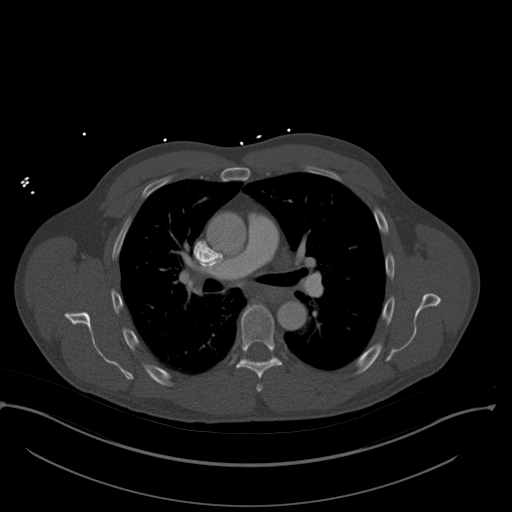
[im 205/220  soft-tissue]
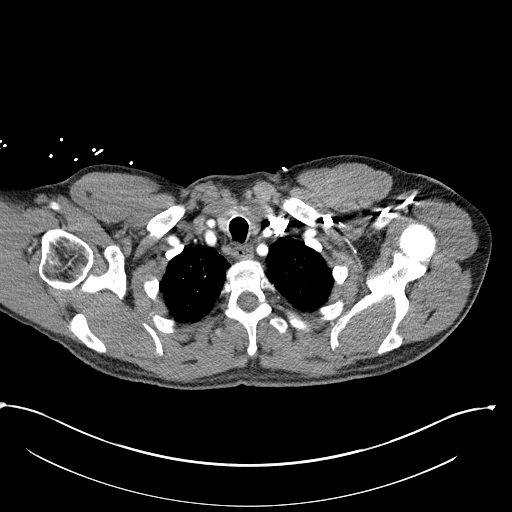

[Series 7: coronals · coronal · 0.76mm/px · 3 of 139 slices shown]
[im 35/139  soft-tissue]
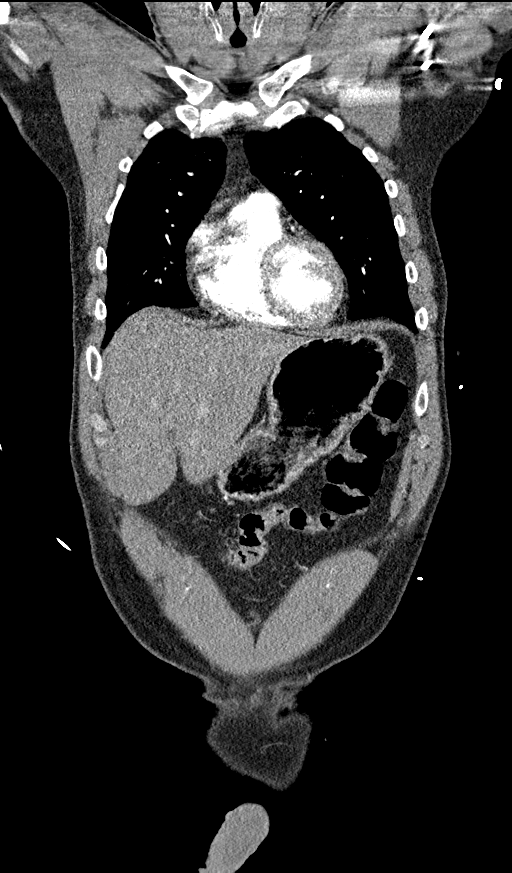
[im 70/139  soft-tissue]
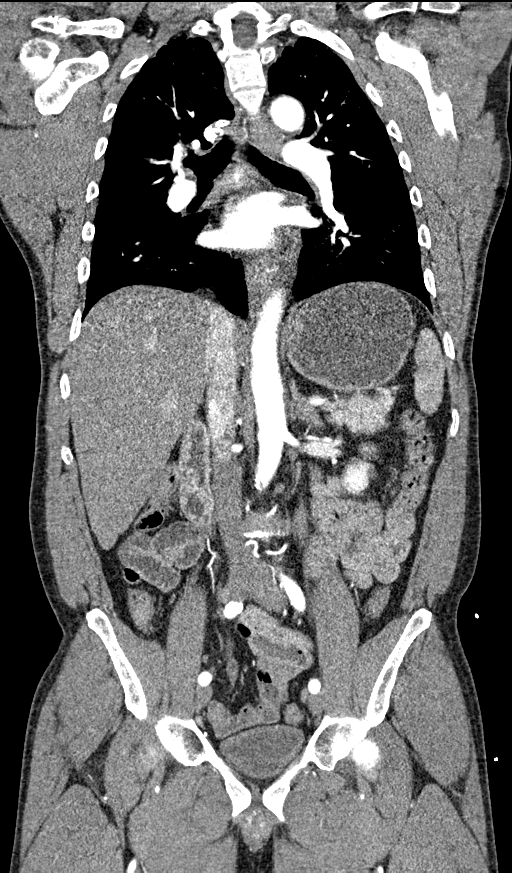
[im 104/139  soft-tissue]
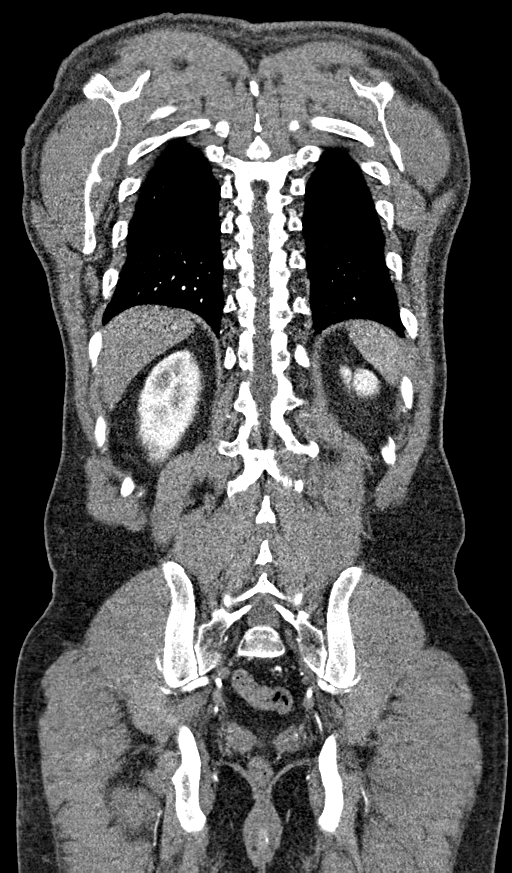

[13 of 46 positions shown; findings below may reference images not displayed]

Multidetector CT imaging through the chest, abdomen and pelvis was
performed using the standard protocol during bolus administration of
intravenous contrast. Multiplanar reconstructed images and MIPs were
obtained and reviewed to evaluate the vascular anatomy.

CONTRAST:  100mL OMNIPAQUE IOHEXOL 350 MG/ML SOLN
FINDINGS: CTA CHEST FINDINGS

Cardiovascular: Thoracic aorta is unremarkable without aneurysm or
dissection.

The heart is unremarkable without pericardial effusion. Minimal
atherosclerosis within the coronary vasculature.

While not optimized for opacification of the pulmonary vasculature,
there is sufficient contrast enhancement to exclude pulmonary
emboli.

Mediastinum/Nodes: No enlarged mediastinal, hilar, or axillary lymph
nodes. Thyroid gland, trachea, and esophagus demonstrate no
significant findings.

Lungs/Pleura: Mild upper lobe predominant emphysema. No airspace
disease, effusion, or pneumothorax.

Musculoskeletal: No acute or destructive bony lesions. Reconstructed
images demonstrate no additional findings.

Review of the MIP images confirms the above findings.

CTA ABDOMEN AND PELVIS FINDINGS

VASCULAR

Aorta: Normal caliber aorta without aneurysm, dissection, vasculitis
or significant stenosis. Scattered atherosclerosis of the distal
aorta.

Celiac: Patent without evidence of aneurysm, dissection, vasculitis
or significant stenosis.

SMA: Patent without evidence of aneurysm, dissection, vasculitis or
significant stenosis.

Renals: Both renal arteries are patent without evidence of aneurysm,
dissection, vasculitis, fibromuscular dysplasia or significant
stenosis. There are bilateral accessory renal arteries, supplying
the upper pole the right kidney in the lower pole the left kidney.
These vessels are also widely patent.

IMA: Patent without evidence of aneurysm, dissection, vasculitis or
significant stenosis.

Inflow: Patent without evidence of aneurysm, dissection, vasculitis
or significant stenosis.

Veins: No obvious venous abnormality within the limitations of this
arterial phase study.

Review of the MIP images confirms the above findings.

NON-VASCULAR

Hepatobiliary: There is diffuse hepatic steatosis. Gallbladder is
decompressed but otherwise unremarkable.

Pancreas: Unremarkable. No pancreatic ductal dilatation or
surrounding inflammatory changes.

Spleen: Normal in size without focal abnormality.

Adrenals/Urinary Tract: Adrenal glands are unremarkable. Kidneys are
normal, without renal calculi, focal lesion, or hydronephrosis.
Bladder is unremarkable.

Stomach/Bowel: No bowel obstruction or ileus. No wall thickening or
inflammatory change. Normal appendix right lower quadrant.

Lymphatic: No pathologic adenopathy.

Reproductive: Prostate is unremarkable.

Other: No free fluid or free gas. No abdominal wall hernia.

Musculoskeletal: No acute or destructive bony lesions. Reconstructed
images demonstrate no additional findings.

Review of the MIP images confirms the above findings.
IMPRESSION: 1. No evidence of thoracic or abdominal aortic aneurysm or
dissection.
2. No evidence of pulmonary embolism.
3. Diffuse hepatic steatosis.
4. Aortic Atherosclerosis (YQHNB-0QH.H) and Emphysema (YQHNB-9T2.4).

## 2022-05-21 ENCOUNTER — Emergency Department (HOSPITAL_COMMUNITY): Payer: Self-pay

## 2022-05-21 ENCOUNTER — Encounter (HOSPITAL_COMMUNITY): Payer: Self-pay | Admitting: Emergency Medicine

## 2022-05-21 ENCOUNTER — Other Ambulatory Visit: Payer: Self-pay

## 2022-05-21 ENCOUNTER — Emergency Department (HOSPITAL_COMMUNITY)
Admission: EM | Admit: 2022-05-21 | Discharge: 2022-05-22 | Discharge status: Against Medical Advice | Payer: Self-pay | Attending: Emergency Medicine | Admitting: Emergency Medicine

## 2022-05-21 DIAGNOSIS — F1310 Sedative, hypnotic or anxiolytic abuse, uncomplicated: Secondary | ICD-10-CM | POA: Insufficient documentation

## 2022-05-21 DIAGNOSIS — Y907 Blood alcohol level of 200-239 mg/100 ml: Secondary | ICD-10-CM | POA: Insufficient documentation

## 2022-05-21 DIAGNOSIS — F1210 Cannabis abuse, uncomplicated: Secondary | ICD-10-CM | POA: Insufficient documentation

## 2022-05-21 DIAGNOSIS — F10120 Alcohol abuse with intoxication, uncomplicated: Secondary | ICD-10-CM | POA: Insufficient documentation

## 2022-05-21 DIAGNOSIS — Z5321 Procedure and treatment not carried out due to patient leaving prior to being seen by health care provider: Secondary | ICD-10-CM | POA: Insufficient documentation

## 2022-05-21 DIAGNOSIS — R55 Syncope and collapse: Secondary | ICD-10-CM | POA: Insufficient documentation

## 2022-05-21 LAB — COMPREHENSIVE METABOLIC PANEL
ALT: 23 U/L (ref 0–44)
AST: 29 U/L (ref 15–41)
Albumin: 4.5 g/dL (ref 3.5–5.0)
Alkaline Phosphatase: 69 U/L (ref 38–126)
Anion gap: 11 (ref 5–15)
BUN: 16 mg/dL (ref 6–20)
CO2: 23 mmol/L (ref 22–32)
Calcium: 8.5 mg/dL — ABNORMAL LOW (ref 8.9–10.3)
Chloride: 107 mmol/L (ref 98–111)
Creatinine, Ser: 1.13 mg/dL (ref 0.61–1.24)
GFR, Estimated: >60 mL/min (ref >60)
Glucose, Bld: 63 mg/dL — ABNORMAL LOW (ref 70–99)
Potassium: 3.6 mmol/L (ref 3.5–5.1)
Sodium: 141 mmol/L (ref 135–145)
Total Bilirubin: 0.5 mg/dL (ref 0.3–1.2)
Total Protein: 7.3 g/dL (ref 6.5–8.1)

## 2022-05-21 LAB — CBC
HCT: 42.9 % (ref 39.0–52.0)
Hemoglobin: 14.2 g/dL (ref 13.0–17.0)
MCH: 33.6 pg (ref 26.0–34.0)
MCHC: 33.1 g/dL (ref 30.0–36.0)
MCV: 101.4 fL — ABNORMAL HIGH (ref 80.0–100.0)
Platelets: 205 10*3/uL (ref 150–400)
RBC: 4.23 MIL/uL (ref 4.22–5.81)
RDW: 12.6 % (ref 11.5–15.5)
WBC: 7.3 10*3/uL (ref 4.0–10.5)
nRBC: 0 % (ref 0.0–0.2)

## 2022-05-21 LAB — ETHANOL: Alcohol, Ethyl (B): 226 mg/dL — ABNORMAL HIGH (ref <10)

## 2022-05-21 NOTE — ED Provider Triage Note (Signed)
Emergency Medicine Provider Triage Evaluation Note  Hoyt Leanos , a 51 y.o. male  was evaluated in triage.  [facility] [place] [facility] [place] [facility] for ETOH and fall. EMS reports patient was standing in line and passed out, sliding down the wall to the ground.   Patient can answer yes or no questions, but does not wake to elaborate  Review of Systems  Positive:  Negative: CP, abd pain, neck pain  Physical Exam  BP (!) 106/53   Pulse 92   Temp 97.6 F (36.4 C) (Oral)   Resp 14   SpO2 94%  Gen:   Awake, no distress   Resp:  Normal effort  MSK:   Moves extremities without difficulty  Other:  Responsive to verbal stimuli, clearly intoxication, dried blood on left arm from abrasion  Medical Decision Making  Medically screening exam initiated at 9:52 PM.  Appropriate orders placed.  Lief Mane was informed that the remainder of the evaluation will be completed by another provider, this initial triage assessment does not replace that evaluation, and the importance of remaining in the ED until their evaluation is complete.  Workup initiated   Kateri Plummer, PA-C 05/21/22 2153

## 2022-05-21 NOTE — ED Triage Notes (Signed)
Pt BIB [facility] from [facility] in [place]. ETOH. Passed out standing in line. Slid down the wall. C collar in place.

## 2022-05-22 LAB — RAPID URINE DRUG SCREEN, HOSP PERFORMED
Amphetamines: NOT DETECTED
Barbiturates: NOT DETECTED
Benzodiazepines: POSITIVE — AB
Cocaine: NOT DETECTED
Opiates: NOT DETECTED
Tetrahydrocannabinol: POSITIVE — AB

## 2022-05-22 NOTE — ED Notes (Signed)
Pt eloped from room.

## 2022-06-18 ENCOUNTER — Emergency Department (HOSPITAL_BASED_OUTPATIENT_CLINIC_OR_DEPARTMENT_OTHER)
Admission: EM | Admit: 2022-06-18 | Discharge: 2022-06-19 | Disposition: A | Discharge status: Home / Self Care | Payer: Self-pay | Attending: Emergency Medicine | Admitting: Emergency Medicine

## 2022-06-18 ENCOUNTER — Other Ambulatory Visit: Payer: Self-pay

## 2022-06-18 ENCOUNTER — Encounter (HOSPITAL_BASED_OUTPATIENT_CLINIC_OR_DEPARTMENT_OTHER): Payer: Self-pay

## 2022-06-18 DIAGNOSIS — F10129 Alcohol abuse with intoxication, unspecified: Secondary | ICD-10-CM | POA: Insufficient documentation

## 2022-06-18 DIAGNOSIS — R410 Disorientation, unspecified: Secondary | ICD-10-CM | POA: Insufficient documentation

## 2022-06-18 DIAGNOSIS — M542 Cervicalgia: Secondary | ICD-10-CM | POA: Insufficient documentation

## 2022-06-18 DIAGNOSIS — Y906 Blood alcohol level of 120-199 mg/100 ml: Secondary | ICD-10-CM | POA: Insufficient documentation

## 2022-06-18 DIAGNOSIS — F10920 Alcohol use, unspecified with intoxication, uncomplicated: Secondary | ICD-10-CM

## 2022-06-18 NOTE — ED Triage Notes (Signed)
BIB ems for alcohol intoxication. Pt alert and oriented x 3. Words slurred. Visibly intoxicated. Drowsy.

## 2022-06-19 ENCOUNTER — Emergency Department (HOSPITAL_BASED_OUTPATIENT_CLINIC_OR_DEPARTMENT_OTHER): Payer: Self-pay

## 2022-06-19 LAB — CBC WITH DIFFERENTIAL/PLATELET
Abs Immature Granulocytes: 0.02 10*3/uL (ref 0.00–0.07)
Basophils Absolute: 0.1 10*3/uL (ref 0.0–0.1)
Basophils Relative: 1 %
Eosinophils Absolute: 0.1 10*3/uL (ref 0.0–0.5)
Eosinophils Relative: 1 %
HCT: 42.4 % (ref 39.0–52.0)
Hemoglobin: 14.4 g/dL (ref 13.0–17.0)
Immature Granulocytes: 0 %
Lymphocytes Relative: 44 %
Lymphs Abs: 4 10*3/uL (ref 0.7–4.0)
MCH: 34 pg (ref 26.0–34.0)
MCHC: 34 g/dL (ref 30.0–36.0)
MCV: 100 fL (ref 80.0–100.0)
Monocytes Absolute: 0.6 10*3/uL (ref 0.1–1.0)
Monocytes Relative: 7 %
Neutro Abs: 4.3 10*3/uL (ref 1.7–7.7)
Neutrophils Relative %: 47 %
Platelets: 184 10*3/uL (ref 150–400)
RBC: 4.24 MIL/uL (ref 4.22–5.81)
RDW: 12.8 % (ref 11.5–15.5)
WBC: 9.1 10*3/uL (ref 4.0–10.5)
nRBC: 0 % (ref 0.0–0.2)

## 2022-06-19 LAB — COMPREHENSIVE METABOLIC PANEL
ALT: 27 U/L (ref 0–44)
AST: 38 U/L (ref 15–41)
Albumin: 4.6 g/dL (ref 3.5–5.0)
Alkaline Phosphatase: 84 U/L (ref 38–126)
Anion gap: 11 (ref 5–15)
BUN: 17 mg/dL (ref 6–20)
CO2: 23 mmol/L (ref 22–32)
Calcium: 8.5 mg/dL — ABNORMAL LOW (ref 8.9–10.3)
Chloride: 100 mmol/L (ref 98–111)
Creatinine, Ser: 1.22 mg/dL (ref 0.61–1.24)
GFR, Estimated: >60 mL/min (ref >60)
Glucose, Bld: 109 mg/dL — ABNORMAL HIGH (ref 70–99)
Potassium: 3.9 mmol/L (ref 3.5–5.1)
Sodium: 134 mmol/L — ABNORMAL LOW (ref 135–145)
Total Bilirubin: 0.4 mg/dL (ref 0.3–1.2)
Total Protein: 7.5 g/dL (ref 6.5–8.1)

## 2022-06-19 LAB — ETHANOL: Alcohol, Ethyl (B): 176 mg/dL — ABNORMAL HIGH (ref <10)

## 2022-06-19 LAB — LIPASE, BLOOD: Lipase: 20 U/L (ref 11–51)

## 2022-06-19 LAB — LACTIC ACID, PLASMA: Lactic Acid, Venous: 1.8 mmol/L (ref 0.5–1.9)

## 2022-06-19 MED ORDER — LACTATED RINGERS IV BOLUS
1000.0000 mL | Freq: Once | INTRAVENOUS | Status: AC
  Administered 2022-06-19: 1000 mL via INTRAVENOUS

## 2022-06-19 NOTE — ED Provider Notes (Signed)
MEDCENTER [facility] EMERGENCY DEPT  Provider Note  CSN: [id number] Arrival date & time: 06/18/22 2216  History Chief Complaint  Patient presents with   Alcohol Intoxication    Larry Gardner is a 51 y.o. male brought to the ED via EMS who reported they found him on the side of the road with suspected alcohol intoxication. Per their report he had normal vitals and was alert and oriented x 3. On arrival here his BP was low and he was obtunded. Unable to provide any further history but has been complaining of neck pain.    Home Medications Prior to Admission medications   Medication Sig Start Date End Date Taking? Authorizing Provider  DULoxetine (CYMBALTA) 30 MG capsule Take 1 capsule (30 mg total) by mouth daily. Patient not taking: Reported on 06/30/2020 08/17/17   Marcine Matar, MD  ibuprofen (ADVIL) 200 MG tablet Take 200-400 mg by mouth every 6 (six) hours as needed for moderate pain.    [provider]  meloxicam (MOBIC) 15 MG tablet Take 1 tablet (15 mg total) by mouth daily. Patient not taking: Reported on 06/30/2020 08/17/17   Marcine Matar, MD  omeprazole (PRILOSEC) 40 MG capsule Take 1 capsule (40 mg total) by mouth 2 (two) times daily before a meal. 07/01/20   Dartha Lodge, PA-C  sucralfate (CARAFATE) 1 GM/10ML suspension Take 10 mLs (1 g total) by mouth 4 (four) times daily -  with meals and at bedtime. 07/01/20   Dartha Lodge, PA-C     Allergies    Patient has no known allergies.   Review of Systems   Review of Systems Please see HPI for pertinent positives and negatives  Physical Exam BP 106/75   Pulse 74   Temp (!) 97 F (36.1 C) (Temporal)   Resp 14   Ht 5\' 8"  (1.727 m)   Wt 74.8 kg   SpO2 100%   BMI 25.09 kg/m   Physical Exam Vitals and nursing note reviewed.  Constitutional:      Appearance: Normal appearance.     Comments: Obtunded  HENT:     Head: Normocephalic and atraumatic.     Nose: Nose normal.      Mouth/Throat:     Mouth: Mucous membranes are moist.  Eyes:     Extraocular Movements: Extraocular movements intact.     Conjunctiva/sclera: Conjunctivae normal.     Pupils: Pupils are equal, round, and reactive to light.     Comments: No pinpoint pupils  Cardiovascular:     Rate and Rhythm: Normal rate.  Pulmonary:     Effort: Pulmonary effort is normal.     Breath sounds: Normal breath sounds.  Abdominal:     General: Abdomen is flat.     Palpations: Abdomen is soft.     Tenderness: There is no abdominal tenderness. There is no guarding.  Musculoskeletal:        General: No swelling. Normal range of motion.     Cervical back: Neck supple. No tenderness.  Skin:    General: Skin is warm and dry.  Neurological:     Mental Status: He is disoriented.     Comments: Moving all extremities  Psychiatric:     Comments: Unable to assess     ED Results / Procedures / Treatments   EKG EKG Interpretation  Date/Time:  Sunday June 19 2022 00:03:12 EST Ventricular Rate:  70 PR Interval:  215 QRS Duration: 112 QT Interval:  442 QTC Calculation:  477 R Axis:   68 Text Interpretation: Sinus rhythm Prolonged PR interval Probable left ventricular hypertrophy Lateral infarct, recent Anterior ST elevation, probably due to LVH No significant change since last tracing Confirmed by Susy Frizzle [phone]) on 06/19/2022 12:08:19 AM  Procedures Procedures  Medications Ordered in the ED Medications  lactated ringers bolus 1,000 mL (0 mLs Intravenous Stopped 06/19/22 0155)    Initial Impression and Plan  Patient with AMS of unclear etiology. Not clear why EMS suspected EtOH but noted to be persistently hypotensive on arrival here. Will check labs, CT head/cspine for signs of trauma/bleeding, no external signs of injury. Begin IVF for hypotension and monitor for improvement.   ED Course   Clinical Course as of 06/19/22 0606  Sun Jun 19, 2022  0029 CBC is normal.  [CS]  0110 CMP and  lipase are unremarkable. Lactic acid is neg. ETOH is elevated.  [CS]  0110 I personally viewed the images from radiology studies and agree with radiologist interpretation:  CXR, CT both neg for acute process.  [CS]  0245 BP is improving. Patient remains somnolent. Review of EMR shows a visit to [facility] for similar in October but he left AMA then. UDS at that visit positive for Benzos and THC so I suspect polysubstance use disorder would account for his somnolence with only mildly elevated EtOH. [CS]  0535 Patient continues to sleep soundly. Vitals improved. He has had some hypoventilation while sleeping, required some supplemental O2.  [CS]  [id number] Patient now awake and alert, steady on his feet and ready for discharge. Does not want to stay for urine testing. Advised to avoid excessive alcohol or drug use. RTED for any other concerns.  [CS]    Clinical Course User Index [CS] Pollyann Savoy, MD     MDM Rules/Calculators/A&P Medical Decision Making Problems Addressed: Alcoholic intoxication without complication [facility]): acute illness or injury  Amount and/or Complexity of Data Reviewed Labs: ordered. Decision-making details documented in ED Course. Radiology: ordered and independent interpretation performed. Decision-making details documented in ED Course. ECG/medicine tests: ordered and independent interpretation performed. Decision-making details documented in ED Course.    Final Clinical Impression(s) / ED Diagnoses Final diagnoses:  Alcoholic intoxication without complication [facility])    Rx / DC Orders ED Discharge Orders     None        Pollyann Savoy, MD 06/19/22 [phone]

## 2022-06-19 NOTE — ED Notes (Signed)
Pt placed in room 12. B/p 85/60. Pt's words are slurred and pt is very drowsy. C/o posterior neck pain. Unable to answer any questions other than verifying his name and date of birth. Spoke with Dr. Bernette Mayers and asked him to see the patient.

## 2022-06-19 NOTE — ED Notes (Signed)
Patient resting with eyes closed, respirations even and unlabored, equal chest rise and fall noted.  Patient in NAD.  Patient will wake with verbal stimuli and go back to sleep.

## 2022-06-19 NOTE — ED Notes (Addendum)
To CT scan with Fleet Contras, RN

## 2022-06-19 NOTE — ED Notes (Signed)
RT called to pt room for desaturation. Pt sats down to 85%. Pt placed on simple mask 6Lpm w/sat increase to 96%. Pt respiratory status stable w/no distress noted at this time. Pt BLBS diminished/minimal air movement. RT notified MD. RT will continue to monitor while at [facility] ED.

## 2022-06-19 NOTE — ED Provider Notes (Incomplete)
[place] EMERGENCY DEPT  Provider Note  CSN: [id number] Arrival date & time: 06/18/22 2216  History Chief Complaint  Patient presents with  . Alcohol Intoxication    Larry Gardner is a 51 y.o. male brought to the ED via EMS who reported they found him on the side of the road with suspected alcohol intoxication. Per their report he had normal vitals and was alert and oriented x 3. On arrival here his BP was low and he was obtunded. Unable to provide any further history but has been complaining of neck pain.    Home Medications Prior to Admission medications   Medication Sig Start Date End Date Taking? Authorizing Provider  DULoxetine (CYMBALTA) 30 MG capsule Take 1 capsule (30 mg total) by mouth daily. Patient not taking: Reported on 06/30/2020 08/17/17   Ladell Pier, MD  ibuprofen (ADVIL) 200 MG tablet Take 200-400 mg by mouth every 6 (six) hours as needed for moderate pain.    [provider]  meloxicam (MOBIC) 15 MG tablet Take 1 tablet (15 mg total) by mouth daily. Patient not taking: Reported on 06/30/2020 08/17/17   Ladell Pier, MD  omeprazole (PRILOSEC) 40 MG capsule Take 1 capsule (40 mg total) by mouth 2 (two) times daily before a meal. 07/01/20   Jacqlyn Larsen, PA-C  sucralfate (CARAFATE) 1 GM/10ML suspension Take 10 mLs (1 g total) by mouth 4 (four) times daily -  with meals and at bedtime. 07/01/20   Jacqlyn Larsen, PA-C     Allergies    Patient has no known allergies.   Review of Systems   Review of Systems Please see HPI for pertinent positives and negatives  Physical Exam BP (!) 85/60   Pulse 67   Temp 97.6 F (36.4 C) (Temporal)   Resp (!) 22   Ht 5\' 8"  (1.727 m)   Wt 74.8 kg   SpO2 92%   BMI 25.09 kg/m   Physical Exam Vitals and nursing note reviewed.  Constitutional:      Appearance: Normal appearance.     Comments: Obtunded  HENT:     Head: Normocephalic and atraumatic.     Nose: Nose normal.      Mouth/Throat:     Mouth: Mucous membranes are moist.  Eyes:     Extraocular Movements: Extraocular movements intact.     Conjunctiva/sclera: Conjunctivae normal.  Cardiovascular:     Rate and Rhythm: Normal rate.  Pulmonary:     Effort: Pulmonary effort is normal.     Breath sounds: Normal breath sounds.  Abdominal:     General: Abdomen is flat.     Palpations: Abdomen is soft.     Tenderness: There is no abdominal tenderness. There is no guarding.  Musculoskeletal:        General: No swelling. Normal range of motion.     Cervical back: Neck supple. No tenderness.  Skin:    General: Skin is warm and dry.  Neurological:     Mental Status: He is disoriented.     Comments: Moving all extremities  Psychiatric:     Comments: Unable to assess     ED Results / Procedures / Treatments   EKG None  Procedures Procedures  Medications Ordered in the ED Medications  lactated ringers bolus 1,000 mL (has no administration in time range)    Initial Impression and Plan  Patient with AMS of unclear etiology. Not clear why EMS suspected EtOH  ED Course  MDM Rules/Calculators/A&P Medical Decision Making Amount and/or Complexity of Data Reviewed Labs: ordered. Radiology: ordered.    Final Clinical Impression(s) / ED Diagnoses Final diagnoses:  None    Rx / DC Orders ED Discharge Orders     None

## 2022-07-21 ENCOUNTER — Telehealth (INDEPENDENT_AMBULATORY_CARE_PROVIDER_SITE_OTHER): Payer: Self-pay | Admitting: Emergency Medicine

## 2022-07-21 NOTE — Telephone Encounter (Signed)
Copied from CRM [id number]. Topic: Appointment Scheduling - Scheduling Inquiry for Clinic >> Jul 21, 2022 12:42 PM Ja-Kwan M wrote: Reason for CRM: Pt requests return call to schedule appt for orange card. Cb# [phone]

## 2022-08-05 ENCOUNTER — Ambulatory Visit: Payer: Self-pay | Admitting: Internal Medicine

## 2022-08-05 NOTE — Progress Notes (Deleted)
  CC: Establish Care  HPI:  [name] is a 52 y.o. male with a past medical history of alcohol use disorder and polysubstance use disorder and presenting to the clinic today for establishment of care. Please see problem based assessment and plan for additional details.  Past Medical History:  Diagnosis Date   Anxiety    Arthritis         Current Outpatient Medications (Analgesics):    ibuprofen (ADVIL) 200 MG tablet, Take 200-400 mg by mouth every 6 (six) hours as needed for moderate pain.   meloxicam (MOBIC) 15 MG tablet, Take 1 tablet (15 mg total) by mouth daily. (Patient not taking: Reported on 06/30/2020)   Current Outpatient Medications (Other):    DULoxetine (CYMBALTA) 30 MG capsule, Take 1 capsule (30 mg total) by mouth daily. (Patient not taking: Reported on 06/30/2020)   omeprazole (PRILOSEC) 40 MG capsule, Take 1 capsule (40 mg total) by mouth 2 (two) times daily before a meal.   sucralfate (CARAFATE) 1 GM/10ML suspension, Take 10 mLs (1 g total) by mouth 4 (four) times daily -  with meals and at bedtime.  Family History  Problem Relation Age of Onset   Hyperlipidemia Mother    Hypertension Father    Hyperlipidemia Father     Social History: ***  Review of Systems: ROS negative except for what is noted on the assessment and plan.  There were no vitals filed for this visit.   Physical Exam: General: Well appearing ***, NAD HENT: normocephalic, atraumatic EYES: conjunctiva non-erythematous, no scleral icterus CV: regular rate, normal rhythm, no murmurs, rubs, gallops. Pulmonary: normal work of breathing on RA, lungs clear to auscultation, no rales, wheezes, rhonchi Abdominal: non-distended, soft, non-tender to palpation, normal BS Skin: Warm and dry, no rashes or lesions Neurological: MS: awake, alert and oriented x3, normal speech and fund of knowledge Motor: moves all extremities antigravity Psych: normal affect    Assessment & Plan:   No  problem-specific Assessment & Plan notes found for this encounter.   See Encounters Tab for problem based charting.  Patient discussed with Dr. {NAMES:[id number]::"Guilloud","Hoffman","Mullen","Narendra","Vincent","Machen","Lau","Hatcher"} Idamae Schuller, MD Tillie Rung. [facility] Internal Medicine Residency, PGY-2   Prediabetes 5.8 in 2019

## 2022-08-23 ENCOUNTER — Other Ambulatory Visit: Payer: Self-pay

## 2022-08-23 ENCOUNTER — Other Ambulatory Visit (HOSPITAL_BASED_OUTPATIENT_CLINIC_OR_DEPARTMENT_OTHER): Payer: Self-pay

## 2022-08-23 ENCOUNTER — Encounter: Payer: Self-pay | Admitting: Student

## 2022-08-23 ENCOUNTER — Other Ambulatory Visit (HOSPITAL_COMMUNITY): Payer: Self-pay

## 2022-08-23 ENCOUNTER — Ambulatory Visit (INDEPENDENT_AMBULATORY_CARE_PROVIDER_SITE_OTHER): Payer: Self-pay | Admitting: Student

## 2022-08-23 VITALS — BP 151/88 | HR 76 | Temp 98.1°F | Ht 68.0 in | Wt 181.9 lb

## 2022-08-23 DIAGNOSIS — I10 Essential (primary) hypertension: Secondary | ICD-10-CM | POA: Insufficient documentation

## 2022-08-23 DIAGNOSIS — Z0000 Encounter for general adult medical examination without abnormal findings: Secondary | ICD-10-CM | POA: Insufficient documentation

## 2022-08-23 DIAGNOSIS — F10929 Alcohol use, unspecified with intoxication, unspecified: Secondary | ICD-10-CM | POA: Insufficient documentation

## 2022-08-23 DIAGNOSIS — M2550 Pain in unspecified joint: Secondary | ICD-10-CM

## 2022-08-23 DIAGNOSIS — F10920 Alcohol use, unspecified with intoxication, uncomplicated: Secondary | ICD-10-CM

## 2022-08-23 DIAGNOSIS — F419 Anxiety disorder, unspecified: Secondary | ICD-10-CM

## 2022-08-23 DIAGNOSIS — F17210 Nicotine dependence, cigarettes, uncomplicated: Secondary | ICD-10-CM

## 2022-08-23 MED ORDER — DULOXETINE HCL 30 MG PO CPEP
30.0000 mg | ORAL_CAPSULE | Freq: Every day | ORAL | 3 refills | Status: AC
  Filled 2022-08-23 (×2): qty 30, 30d supply, fill #0

## 2022-08-23 MED ORDER — LOSARTAN POTASSIUM-HCTZ 50-12.5 MG PO TABS
1.0000 | ORAL_TABLET | Freq: Every day | ORAL | 3 refills | Status: AC
  Filled 2022-08-23: qty 90, 90d supply, fill #0
  Filled 2022-08-23: qty 30, 30d supply, fill #0

## 2022-08-23 MED ORDER — LOSARTAN POTASSIUM 25 MG PO TABS
25.0000 mg | ORAL_TABLET | Freq: Every day | ORAL | 11 refills | Status: DC
  Filled 2022-08-23: qty 30, 30d supply, fill #0
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
## 2022-08-23 NOTE — Assessment & Plan Note (Signed)
Patient with neck pain and cervical radiculopathy. Pain characterized by burning/ pins and needles traveling down both arms. CT cervical spine in 2023 with mild degenerative changes of the mid cervical spine. Spinal canal is patent.  -trial Duloxetine 30 mg -Working on mental health and coping mechanisms -Once insurance coverage, will refer to PT

## 2022-08-23 NOTE — Assessment & Plan Note (Signed)
Will defer testing/screenings until patient has insurance coverage per patient's preference

## 2022-08-23 NOTE — Patient Instructions (Signed)
Thank you, Mr.Rafay Fogleman for allowing [place] to provide your care today. Today we discussed   Blood pressure  Pick up Losartan - take one pill daily Measure your blood pressure daily Blood pressure tips   It is best to check your BP 1-2 hours after taking your medications to see the medications effectiveness on your BP.    Here are some tips that our clinical pharmacists share for home BP monitoring:          Rest 10 minutes before taking your blood pressure.          Don't smoke or drink caffeinated beverages for at least 30 minutes before.          Take your blood pressure before (not after) you eat.          Sit comfortably with your back supported and both feet on the floor (don't cross your legs).          Elevate your arm to heart level on a table or a desk.          Use the proper sized cuff. It should fit smoothly and snugly around your bare upper arm. There should be enough room to slip a fingertip under the cuff. The bottom edge of the cuff should be 1 inch above the crease of the elbow.   Anxiety Pick up duloxetine Take one pill daily - at bedtime is best as this medication can make you sleepy   I have ordered the following labs for you:   Lab Orders         BMP8+Anion Gap      I will call if any are abnormal. All of your labs can be accessed through "My Chart".   My Chart Access: [email or web address]  Please follow-up in in 2 weeks.  Please make sure to arrive 15 minutes prior to your next appointment. If you arrive late, you may be asked to reschedule.    We look forward to seeing you next time. Please call our clinic at [phone] if you have any questions or concerns. The best time to call is Monday-Friday from 9am-4pm, but there is someone available 24/7. If after hours or the weekend, call the main hospital number and ask for the Internal Medicine Resident On-Call. If you need medication refills, please notify  your pharmacy one week in advance and they will send [place] a request.   Thank you for letting us take part in your care. Wishing you the best!  Romana Juniper, MD 08/23/2022, 4:19 PM [facility] Internal Medicine Resident, PGY-1

## 2022-08-23 NOTE — Assessment & Plan Note (Signed)
Patient presented to ED with alcohol intoxication twice in the past 6 months. Patient reports that these episodes were coping mechanisms for stressors and difficulty with anxiety he has had. He has not had a drink since November.

## 2022-08-23 NOTE — Assessment & Plan Note (Signed)
Patient with uptrend ing SBPs during OV. Low BP present only ED visits for for alcohol intoxication. Today patient presents to clinic to establish care and his BP is elevated. He denies chest pain, shortness of breath, palpitations, changes in vision, or altered mentation.  Vitals:   08/23/22 1544 08/23/22 1625  BP: (!) 164/85 (!) 151/88   Patient BP is >150 SBP, requiring two medications. Will obtain BMP today and start on Losartan-HCTZ 50-12.5 mg today -follow up in 2 weeks to follow up Cr and K

## 2022-08-24 ENCOUNTER — Other Ambulatory Visit: Payer: Self-pay

## 2022-08-24 ENCOUNTER — Other Ambulatory Visit (HOSPITAL_BASED_OUTPATIENT_CLINIC_OR_DEPARTMENT_OTHER): Payer: Self-pay

## 2022-08-24 ENCOUNTER — Other Ambulatory Visit (HOSPITAL_COMMUNITY): Payer: Self-pay

## 2022-08-24 LAB — BMP8+ANION GAP
Anion Gap: 17 mmol/L (ref 10.0–18.0)
BUN/Creatinine Ratio: 16 (ref 9–20)
BUN: 14 mg/dL (ref 6–24)
CO2: 22 mmol/L (ref 20–29)
Calcium: 9.1 mg/dL (ref 8.7–10.2)
Chloride: 100 mmol/L (ref 96–106)
Creatinine, Ser: 0.88 mg/dL (ref 0.76–1.27)
Glucose: 92 mg/dL (ref 70–99)
Potassium: 4.2 mmol/L (ref 3.5–5.2)
Sodium: 139 mmol/L (ref 134–144)
eGFR: 103 mL/min/{1.73_m2} (ref >59)

## 2022-08-24 NOTE — Progress Notes (Signed)
Patient called and aware.

## 2022-08-25 ENCOUNTER — Other Ambulatory Visit (HOSPITAL_COMMUNITY): Payer: Self-pay

## 2022-08-29 NOTE — Progress Notes (Signed)
Internal Medicine Clinic Attending  Case discussed with the resident at the time of the visit.  We reviewed the resident's history and exam and pertinent patient test results.  I agree with the assessment, diagnosis, and plan of care documented in the resident's note.  

## 2022-08-30 ENCOUNTER — Ambulatory Visit (INDEPENDENT_AMBULATORY_CARE_PROVIDER_SITE_OTHER): Payer: Self-pay | Admitting: Licensed Clinical Social Worker

## 2022-08-30 DIAGNOSIS — F419 Anxiety disorder, unspecified: Secondary | ICD-10-CM

## 2022-08-30 NOTE — BH Specialist Note (Signed)
[place] unable to complete call. Patient would answer phone and Phone call kept ending. [facility] returned call multiples and call went to VM. Patient will need to contact [facility] to reschedule appointment.  Milus Height, MSW, [place]  [facility] Direct Dial:[phone]  Fax [phone] Main Office Phone: [phone] [address] [place], [place] [address] Website: [place], [place]

## 2022-09-01 ENCOUNTER — Other Ambulatory Visit (HOSPITAL_BASED_OUTPATIENT_CLINIC_OR_DEPARTMENT_OTHER): Payer: Self-pay

## 2022-09-06 ENCOUNTER — Encounter: Payer: Self-pay | Admitting: Student

## 2023-02-06 ENCOUNTER — Emergency Department (HOSPITAL_BASED_OUTPATIENT_CLINIC_OR_DEPARTMENT_OTHER)
Admission: EM | Admit: 2023-02-06 | Discharge: 2023-02-07 | Disposition: A | Discharge status: Other Acute Inpatient Hospital | Payer: Medicaid Other | Attending: Emergency Medicine | Admitting: Emergency Medicine

## 2023-02-06 ENCOUNTER — Other Ambulatory Visit: Payer: Self-pay

## 2023-02-06 ENCOUNTER — Encounter (HOSPITAL_BASED_OUTPATIENT_CLINIC_OR_DEPARTMENT_OTHER): Payer: Self-pay | Admitting: Emergency Medicine

## 2023-02-06 DIAGNOSIS — X58XXXA Exposure to other specified factors, initial encounter: Secondary | ICD-10-CM | POA: Diagnosis not present

## 2023-02-06 DIAGNOSIS — F15959 Other stimulant use, unspecified with stimulant-induced psychotic disorder, unspecified: Secondary | ICD-10-CM | POA: Insufficient documentation

## 2023-02-06 DIAGNOSIS — F14959 Cocaine use, unspecified with cocaine-induced psychotic disorder, unspecified: Secondary | ICD-10-CM | POA: Insufficient documentation

## 2023-02-06 DIAGNOSIS — R000 Tachycardia, unspecified: Secondary | ICD-10-CM | POA: Insufficient documentation

## 2023-02-06 DIAGNOSIS — I10 Essential (primary) hypertension: Secondary | ICD-10-CM | POA: Diagnosis not present

## 2023-02-06 DIAGNOSIS — S8992XA Unspecified injury of left lower leg, initial encounter: Secondary | ICD-10-CM | POA: Diagnosis present

## 2023-02-06 DIAGNOSIS — R45850 Homicidal ideations: Secondary | ICD-10-CM | POA: Diagnosis not present

## 2023-02-06 DIAGNOSIS — F17210 Nicotine dependence, cigarettes, uncomplicated: Secondary | ICD-10-CM | POA: Diagnosis not present

## 2023-02-06 DIAGNOSIS — F19959 Other psychoactive substance use, unspecified with psychoactive substance-induced psychotic disorder, unspecified: Secondary | ICD-10-CM | POA: Insufficient documentation

## 2023-02-06 DIAGNOSIS — R7401 Elevation of levels of liver transaminase levels: Secondary | ICD-10-CM | POA: Insufficient documentation

## 2023-02-06 DIAGNOSIS — S80812A Abrasion, left lower leg, initial encounter: Secondary | ICD-10-CM | POA: Insufficient documentation

## 2023-02-06 DIAGNOSIS — F1910 Other psychoactive substance abuse, uncomplicated: Secondary | ICD-10-CM

## 2023-02-06 DIAGNOSIS — F23 Brief psychotic disorder: Secondary | ICD-10-CM | POA: Diagnosis not present

## 2023-02-06 DIAGNOSIS — Z79899 Other long term (current) drug therapy: Secondary | ICD-10-CM | POA: Diagnosis not present

## 2023-02-06 DIAGNOSIS — S80811A Abrasion, right lower leg, initial encounter: Secondary | ICD-10-CM | POA: Insufficient documentation

## 2023-02-06 LAB — RAPID URINE DRUG SCREEN, HOSP PERFORMED
Amphetamines: POSITIVE — AB
Barbiturates: NOT DETECTED
Benzodiazepines: POSITIVE — AB
Cocaine: POSITIVE — AB
Opiates: NOT DETECTED
Tetrahydrocannabinol: POSITIVE — AB

## 2023-02-06 LAB — SALICYLATE LEVEL: Salicylate Lvl: <7 mg/dL — ABNORMAL LOW (ref 7.0–30.0)

## 2023-02-06 LAB — CBC
HCT: 41.9 % (ref 39.0–52.0)
Hemoglobin: 14.6 g/dL (ref 13.0–17.0)
MCH: 34.2 pg — ABNORMAL HIGH (ref 26.0–34.0)
MCHC: 34.8 g/dL (ref 30.0–36.0)
MCV: 98.1 fL (ref 80.0–100.0)
Platelets: 204 10*3/uL (ref 150–400)
RBC: 4.27 MIL/uL (ref 4.22–5.81)
RDW: 12.1 % (ref 11.5–15.5)
WBC: 9.5 10*3/uL (ref 4.0–10.5)
nRBC: 0 % (ref 0.0–0.2)

## 2023-02-06 LAB — COMPREHENSIVE METABOLIC PANEL
ALT: 70 U/L — ABNORMAL HIGH (ref 0–44)
AST: 236 U/L — ABNORMAL HIGH (ref 15–41)
Albumin: 4.9 g/dL (ref 3.5–5.0)
Alkaline Phosphatase: 66 U/L (ref 38–126)
Anion gap: 10 (ref 5–15)
BUN: 29 mg/dL — ABNORMAL HIGH (ref 6–20)
CO2: 28 mmol/L (ref 22–32)
Calcium: 9.9 mg/dL (ref 8.9–10.3)
Chloride: 97 mmol/L — ABNORMAL LOW (ref 98–111)
Creatinine, Ser: 0.83 mg/dL (ref 0.61–1.24)
GFR, Estimated: >60 mL/min (ref >60)
Glucose, Bld: 105 mg/dL — ABNORMAL HIGH (ref 70–99)
Potassium: 3.4 mmol/L — ABNORMAL LOW (ref 3.5–5.1)
Sodium: 135 mmol/L (ref 135–145)
Total Bilirubin: 1.7 mg/dL — ABNORMAL HIGH (ref 0.3–1.2)
Total Protein: 7.3 g/dL (ref 6.5–8.1)

## 2023-02-06 LAB — ACETAMINOPHEN LEVEL: Acetaminophen (Tylenol), Serum: <10 ug/mL — ABNORMAL LOW (ref 10–30)

## 2023-02-06 LAB — ETHANOL: Alcohol, Ethyl (B): <10 mg/dL (ref <10)

## 2023-02-06 MED ORDER — IBUPROFEN 800 MG PO TABS
800.0000 mg | ORAL_TABLET | Freq: Once | ORAL | Status: AC
  Administered 2023-02-06: 800 mg via ORAL
  Filled 2023-02-06: qty 1

## 2023-02-06 MED ORDER — THIAMINE HCL 100 MG/ML IJ SOLN: 100.0000 mg | Freq: Every day | INTRAMUSCULAR | Status: DC

## 2023-02-06 MED ORDER — FOLIC ACID 1 MG PO TABS
1.0000 mg | ORAL_TABLET | Freq: Every day | ORAL | Status: DC
  Administered 2023-02-06 – 2023-02-07 (×2): 1 mg via ORAL
  Filled 2023-02-06 (×3): qty 1

## 2023-02-06 MED ORDER — ADULT MULTIVITAMIN W/MINERALS CH
1.0000 | ORAL_TABLET | Freq: Every day | ORAL | Status: DC
  Administered 2023-02-06 – 2023-02-07 (×2): 1 via ORAL
  Filled 2023-02-06 (×2): qty 1

## 2023-02-06 MED ORDER — LORAZEPAM 1 MG PO TABS
1.0000 mg | ORAL_TABLET | ORAL | Status: DC | PRN
  Administered 2023-02-06 – 2023-02-07 (×5): 1 mg via ORAL
  Filled 2023-02-06 (×5): qty 1

## 2023-02-06 MED ORDER — THIAMINE MONONITRATE 100 MG PO TABS
100.0000 mg | ORAL_TABLET | Freq: Every day | ORAL | Status: DC
  Administered 2023-02-06 – 2023-02-07 (×2): 100 mg via ORAL
  Filled 2023-02-06 (×2): qty 1

## 2023-02-06 MED ORDER — LORAZEPAM 2 MG/ML IJ SOLN: 1.0000 mg | INTRAMUSCULAR | Status: DC | PRN

## 2023-02-06 NOTE — ED Notes (Signed)
Pt changed to scrubs, wanded by security

## 2023-02-06 NOTE — Progress Notes (Signed)
[facility] LCSW Progress Note   02/06/2023    7:21 PM  Larry Gardner   [id number]   Type of Contact and Topic:  Psychiatric Bed Placement   Pt accepted to [facility]     Patient meets inpatient criteria per Assunta Found, NP  The attending provider will be Dr. Loni Beckwith  Call report to [phone]  Lesleigh Noe, RN @ [facility] notified.     Pt scheduled  to arrive at [facility] AFTER 0900.    Damita Dunnings, MSW, LCSW-A  7:22 PM 02/06/2023

## 2023-02-06 NOTE — Progress Notes (Signed)
Patient has been denied by [facility] due to no appropriate beds available. Patient meets [facility] inpatient criteria per Assunta Found, NP. Patient has been faxed out to the following facilities:   [facility]  [address] [place] [address] [phone]  [facility]  [address] [place] [address] [phone]  [facility]  [address] [place] [address] [phone]  [facility]  [address], [place] [address] [phone]  [facility]  [address] [place] [address] [phone]  [facility]  [address], [place] [address] [phone]  [facility]  [address] [place] [address] [phone]  [facility]  [address] [place], [place] [address] [phone]  [facility]  [address] [place], [place] [address] [phone]  [facility]  [address] [place] [address] [phone]  [facility]  [address] [place] [address] [phone]  [facility]  [address] [place] [address] [phone]  [facility]  [address], [place] [address] [phone]    Damita Dunnings, MSW, LCSW-A  7:05 PM 02/06/2023

## 2023-02-06 NOTE — ED Notes (Signed)
Sitter present

## 2023-02-06 NOTE — ED Notes (Signed)
Patient states he is walking around with a screwdriver  thinking of harming someone.  Asked patient if he had any weapons and he gave me the screwdriver.

## 2023-02-06 NOTE — ED Notes (Signed)
[facility] NP advises IP treatment for pt. MD made aware

## 2023-02-06 NOTE — ED Triage Notes (Signed)
Brought in via [facility] from [facility] for HI. Cooperative.

## 2023-02-06 NOTE — ED Notes (Signed)
Pt belongings are located in locker #4 in purple zone.

## 2023-02-06 NOTE — ED Notes (Addendum)
[employer] at bedside to transport pt to [facility]. Pt to be admitted to [facility] @9am . Accepting MD is Loni Beckwith, reporting # [phone] . Redge Gainer charge RN made aware

## 2023-02-06 NOTE — ED Provider Notes (Signed)
  Arrives from [facility] for HI. He is boarding here overnight, plan to go to [facility] tomorrow morning.  He remains HD stable at this time.  5:56 AM Observed overnight, no acute events.  Awaiting transport this AM to [facility] under care of Dr. Elane Fritz, Rosezella [place], PA-C 02/07/23 0557    Wynetta Fines, MD 02/07/23 [phone]

## 2023-02-06 NOTE — ED Notes (Signed)
[employer] on unit for transport to [facility], okay to transfer per CN, Candace

## 2023-02-06 NOTE — ED Notes (Signed)
Patient belongings listed on Personal belongings Inventory Sheet and patient signed.
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
## 2023-02-06 NOTE — ED Notes (Signed)
Call placed to [facility] CN for possible transfer to [facility], will assess and advise

## 2023-02-06 NOTE — ED Notes (Signed)
Pt given food. Calm and cooperative. Waiting on staffing to send a sitter

## 2023-02-06 NOTE — ED Notes (Signed)
TTS evaluation in process.  

## 2023-02-06 NOTE — BH Assessment (Signed)
Comprehensive Clinical Assessment (CCA) Note  02/06/2023 Larry Gardner [id number]  [place]: Per Assunta Found NP pt is recommended for IP psychiatric treatment.   The patient demonstrates the following risk factors for suicide: Chronic risk factors for suicide include: substance use disorder. Acute risk factors for suicide include: family or marital conflict. Protective factors for this patient include: hope for the future. Considering these factors, the overall suicide risk at this point appears to be low. Patient is appropriate for outpatient follow up.   Pt is a 52 yo male who presented at [facility] reporting HI. Per chart, pt had been stating that he wanted to hurt people with a screwdriver. Per chart, when a nurse asked for any weapons, he gave her the screwdriver. During this assessment, pt stated that he came to [facility] "to kill 5 people upstairs." When asked to clarify, pt stated that he wanted to kill 5 specific people upstairs at [facility] who he says "betrayed" him and "double...triple- crossed" him. He did not explain further and was becoming animated and agitated while talking about it. Pt denied SI, any past attempts and NSSH. When asked if he is having hallucinations/seeing or hearing things others say they are not he answered, "yes, that's what they are telling me but I know what I see and hear." He did not and would not elaborate further. When asked about any IP psychiatric hospitalizations. pt answered that he had been in the hospital "a long time ago when he was in junior high or high school." He could not state why he was hospitalized. Pt denied receiving OP psychiatric services from any providers or being prescribed any psychiatric medications. Pt stated "I need some though. I got anger problems." Pt stated that he has pending charged from "assaulting my 68 yo nephew." Pt stated that there was a dispute over the TV remote and he was accused of assaulting him. Pt  reported daily alcohol use with his last use as yesterday in the amount of "as much as I can get." Pt's UDS was positive for cocaine, benzodiazepines, amphetamines and THC. He denied using any substances other than alcohol.   Pt stated that he lives with his father, sister and nephew. He reported that about 2 days ago he left home for no identified reason. Pt stated that he has not slept or eaten in 2 days. Pt stated he usually sleeps  to 7/8 hours and has a good appetite. Pt state that over the last 2 days he has not been hungry. Pt denied having guns and stated was a felon and could not have them. 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 He did not disclose the condition of their relationship.   Pt was dressed in scrubs sitting on the end of his ED bed.  Pt was alert and oriented. Pt spoke with pressured speech and at times had some disorganized thought. Pt appeared restless, angry, fidgety and agitated. Pt was polite and cooperative to this assessor.    Chief Complaint:  Chief Complaint  Patient presents with   Homicidal   Visit Diagnosis:  Brief Psychotic d/o Substance-Induced psychotic d/o    CCA Screening, Triage and Referral (STR)  Patient Reported Information How did you hear about [place]? Self  What Is the Reason for Your Visit/Call Today? Pt is a 52 yo male who presented at  [facility] reporting HI. Per chart, pt had been stating that he wanted to hurt people with a screwdriver. Per chart, when a nurse asked for any weapons, he gave her the screwdriver. During this assessment, pt stated that he came to [facility] "to kill 5 people upstairs." When asked to clarify, pt stated that he wanted to kill 5 specific people upstairs at [facility] who he says "betrayed" him and "double...triple- crossed" him. He did not explain further and was becoming animated  and agitated while talking about it. Pt denied SI, any past attempts and NSSH. When asked if he is having hallucinations/seeing or hearing things others say they are not he answered, "yes, that's what they are telling me but I know what I see and hear." He did not and would not elaborate further. When asked about any IP psychiatric hospitalizations. pt answered that he had been in the hospital "a long time ago when he was in junior high or high school." He could not state why he was hospitalized. Pt denied receiving OP psychiatric services from any providers or being prescribed any psychiatric medications. Pt stated "I need some though. I got anger problems." Pt stated that he has pending charged from "assaulting my 77 yo nephew." Pt stated that there was a dispute over the TV remote and he was accused of assaulting him. Pt reported daily alcohol use with his last use as yesterday in the amount of "as much as I can get." Pt's UDS was positive for cocaine, benzodiazepines, amphetamines and THC. He denied using any substances other than alcohol.  How Long Has This Been Causing You Problems? <Week ("last few days")  What Do You Feel Would Help You the Most Today? Treatment for Depression or other mood problem (medications)   Have You Recently Had Any Thoughts About Hurting Yourself? No  Are You Planning to Commit Suicide/Harm Yourself At This time? No   Flowsheet Row ED from 02/06/2023 in [facility] Emergency Department at [facility] ED from 06/18/2022 in [facility] Emergency Department at [facility] ED from 05/21/2022 in [facility] Emergency Department at [facility]  C-SSRS RISK CATEGORY No Risk No Risk No Risk       Have you Recently Had Thoughts About Hurting Someone [name]? Yes  Are You Planning to Harm Someone at This Time? Yes  Explanation: Pt stated he wanted to kill "5 people upstairs" at [facility] who he stated had "betrayed" him and "double-crossed"  him.   Have You Used Any Alcohol or Drugs in the Past 24 Hours? Yes  What Did You Use and How Much? Pt's UDS was positive for cocaine, benzodiazepines, amphetamines and THC. He denied using any substances other than alcohol. unknown amounts   Do You Currently Have a Therapist/Psychiatrist? No  Name of Therapist/Psychiatrist: Name of Therapist/Psychiatrist: na   Have You Been Recently Discharged From Any Office Practice or Programs? No  Explanation of Discharge From Practice/Program: none reported     CCA Screening Triage Referral Assessment Type of Contact: Tele-Assessment  Telemedicine Service Delivery:   Is this Initial or Reassessment? Is this Initial or Reassessment?: Initial Assessment  Date Telepsych consult ordered in [facility]:  Date Telepsych consult ordered in [facility]: 02/06/23  Time Telepsych consult ordered in [facility]:  Time Telepsych consult ordered in [facility]: 1520  Location of Assessment: Other (comment) ([facility])  Provider Location: [facility] Assessment Services   Collateral Involvement: none offered/allowed   Does Patient Have a Automotive engineer Guardian?  No (none reported)  Legal Guardian Contact Information: na  Copy of Legal Guardianship Form: No - copy requested  Legal Guardian Notified of Arrival: -- (na)  Legal Guardian Notified of Pending Discharge: -- (na)  If Minor and Not Living with Parent(s), Who has Custody? adult  Is CPS involved or ever been involved? Never (none reported)  Is APS involved or ever been involved? Never (none reported)   Patient Determined To Be At Risk for Harm To Self or Others Based on Review of Patient Reported Information or Presenting Complaint? Yes, for Harm to Others  Method: Plan with intent and identified person  Availability of Means: In hand or used  Intent: Clearly intends on inflicting harm that could cause death  Notification Required: No need or identified person (Per chart, pt identified  people he wanted to hurt to nurse.)  Additional Information for Danger to Others Potential: none Additional Comments for Danger to Others Potential: none  Are There Guns or Other Weapons in Your Home? No (denied- stated was a felone and could not have them.)  Types of Guns/Weapons: na  Are These Weapons Safely Secured?                            -- (na)  Who Could Verify You Are Able To Have These Secured: na  Do You Have any Outstanding Charges, Pending Court Dates, Parole/Probation? Pt stated that he has outstanding pending charges for assaulting his 68 yo nephew.  Contacted To Inform of Risk of Harm To Self or Others: -- ([facility] staff aware of threat per chart and conversation with nurse.)    Does Patient Present under Involuntary Commitment? No    [place] of Residence: [place]   Patient Currently Receiving the Following Services: Not Receiving Services   Determination of Need: Emergent (2 hours) (Per [employer] Rank[employer] NP pt is recommended for IP psychiatric treatment.)   Options For Referral: Inpatient Hospitalization     CCA Biopsychosocial Patient Reported Schizophrenia/Schizoaffective Diagnosis in Past: No (none reported)   Strengths: gave up weapons when asked to by nurse per chart   Mental Health Symptoms Depression:   None   Duration of Depressive symptoms:    Mania:   Increased Energy; Irritability; Overconfidence; Racing thoughts; Recklessness   Anxiety:    None   Psychosis:   Hallucinations (Pt reported others have told him he is having hallucinations. Possible delusional thinking.)   Duration of Psychotic symptoms:  Duration of Psychotic Symptoms: -- (unknown)   Trauma:   None   Obsessions:   None   Compulsions:   None   Inattention:   None; N/A   Hyperactivity/Impulsivity:   None; N/A   Oppositional/Defiant Behaviors:   N/A   Emotional Irregularity:   Mood lability; Potentially harmful impulsivity   Other  Mood/Personality Symptoms:   none    Mental Status Exam Appearance and self-care  Stature:   Average   Weight:   Average weight   Clothing:   Casual; Disheveled (scrubs)   Grooming:   Neglected   Cosmetic use:   None   Posture/gait:   Tense   Motor activity:   Agitated   Sensorium  Attention:   Vigilant; Distractible; Persistent   Concentration:   Focuses on irrelevancies; Preoccupied   Orientation:   Object; Person; Place; Situation; Time; X5   Recall/memory:   Normal   Affect and Mood  Affect:   Other (Comment); Flat; Labile (angry, agitated)  Mood:   Irritable; Other (Comment) (angry)   Relating  Eye contact:   Normal   Facial expression:   Responsive; Angry   Attitude toward examiner:   Cooperative; Dramatic   Thought and Language  Speech flow:  Clear and Coherent; Pressured   Thought content:   Persecutions; Delusions (possible delusions)   Preoccupation:   Homicidal   Hallucinations:   -- (no details given)   Organization:   Loose   Company secretary of Knowledge:   Average   Intelligence:   Average   Abstraction:   Functional   Judgement:   Dangerous   Reality Testing:   Distorted   Insight:   Lacking   Decision Making:   Impulsive   Social Functioning  Social Maturity:   Impulsive   Social Judgement:   Heedless; Victimized   Stress  Stressors:   Family conflict; Legal   Coping Ability:   Deficient supports   Skill Deficits:   Self-control; Responsibility; Interpersonal   Supports:   Friends/Service system; Support needed     Religion: Religion/Spirituality Are You A Religious Person?: Yes What is Your Religious Affiliation?: Christian How Might This Affect Treatment?: unknown  Leisure/Recreation: Leisure / Recreation Do You Have Hobbies?: No  Exercise/Diet: Exercise/Diet Do You Exercise?: No Have You Gained or Lost A Significant Amount of Weight in the Past Six Months?:  No Do You Follow a Special Diet?: No Do You Have Any Trouble Sleeping?: No   CCA Employment/Education Employment/Work Situation: Employment / Work Situation Employment Situation: Employed Work Stressors: none reported Patient's Job has Been Impacted by Current Illness:  (unknown) Has Patient ever Been in Equities trader?:  (unknown)  Education: Education Is Patient Currently Attending School?: No Last Grade Completed: 12 (plus some college) Did Theme park manager?: Yes What Type of College Degree Do you Have?: no degree reported Did You Have An Individualized Education Program (IIEP): No Did You Have Any Difficulty At School?: No (none reported) Patient's Education Has Been Impacted by Current Illness: No   CCA Family/Childhood History Family and Relationship History: Family history Marital status: Single (Pt stated he was twice divorced.) Does patient have children?: Yes How many children?: 1 (adult daughter) How is patient's relationship with their children?: unknown  Childhood History:  Childhood History By whom was/is the patient raised?: Both parents Did patient suffer any verbal/emotional/physical/sexual abuse as a child?: No Has patient ever been sexually abused/assaulted/raped as an adolescent or adult?: No Witnessed domestic violence?: No Has patient been affected by domestic violence as an adult?:  (unknown)       CCA Substance Use Alcohol/Drug Use: Alcohol / Drug Use Pain Medications: see MAR Prescriptions: see MAR Over the Counter: see MAR History of alcohol / drug use?: Yes Longest period of sobriety (when/how long): unknown Negative Consequences of Use:  (none reported- denied use) Withdrawal Symptoms: None Substance #1 Name of Substance 1: alcohol 1 - Age of First Use: 11-11 1 - Amount (size/oz): "as much as I can get"- varies 1 - Frequency: daily 1 - Duration: ongoing 1 - Last Use / Amount: yesterday 1 - Method of Aquiring: purchase 1- Route  of Use: drink, oral Substance #2 Name of Substance 2: cocaine (tested positive on UDS in ED) 2 - Age of First Use: unknown 2 - Amount (size/oz): unknown 2 - Frequency: unknown 2 - Duration: ongoing 2 - Last Use / Amount: unknown 2 - Method of Aquiring: unknown 2 - Route of Substance Use: unknown Substance #3  Name of Substance 3: benzodiazepines (tested positive on UDS in ED) 3 - Age of First Use: unknown 3 - Amount (size/oz): unknown 3 - Frequency: unknown 3 - Duration: unknown 3 - Last Use / Amount: unknown 3 - Method of Aquiring: unknown 3 - Route of Substance Use: unknown Substance #4 Name of Substance 4: amphetamines 4 - Age of First Use: unknown 4 - Amount (size/oz): unknown 4 - Frequency: unknown 4 - Duration: unknown 4 - Last Use / Amount: unknown 4 - Method of Aquiring: unknown 4 - Route of Substance Use: unknown Substance #5 Name of Substance 5: THC 5 - Age of First Use: unknown 5 - Amount (size/oz): unknown 5 - Frequency: unknown 5 - Duration: unknown 5 - Last Use / Amount: unknown 5 - Method of Aquiring: unknown 5 - Route of Substance Use: unknown               ASAM's:  Six Dimensions of Multidimensional Assessment  Dimension 1:  Acute Intoxication and/or Withdrawal Potential:   Dimension 1:  Description of individual's past and current experiences of substance use and withdrawal: none reported- denied  Dimension 2:  Biomedical Conditions and Complications:   Dimension 2:  Description of patient's biomedical conditions and  complications: none reported- denied  Dimension 3:  Emotional, Behavioral, or Cognitive Conditions and Complications:  Dimension 3:  Description of emotional, behavioral, or cognitive conditions and complications: none reported- denied  Dimension 4:  Readiness to Change:     Dimension 5:  Relapse, Continued use, or Continued Problem Potential:     Dimension 6:  Recovery/Living Environment:  Dimension 6:  Recovery/Iiving environment  criteria description: unknown  ASAM Severity Score: ASAM's Severity Rating Score: 9  ASAM Recommended Level of Treatment: ASAM Recommended Level of Treatment: Level II Partial Hospitalization Treatment   Substance use Disorder (SUD) Substance Use Disorder (SUD)  Checklist Symptoms of Substance Use: Continued use despite having a persistent/recurrent physical/psychological problem caused/exacerbated by use, Continued use despite persistent or recurrent social, interpersonal problems, caused or exacerbated by use, Recurrent use that results in a failure to fulfill major role obligations (work, school, home)  Recommendations for Services/Supports/Treatments: Recommendations for Services/Supports/Treatments Recommendations For Services/Supports/Treatments: CD-IOP Intensive Chemical Dependency Program  Discharge Disposition:    DSM5 Diagnoses: Patient Active Problem List   Diagnosis Date Noted   Hypertension 08/23/2022   Alcohol intoxication (HCC) 08/23/2022   Healthcare maintenance 08/23/2022   Polyarthralgia 08/17/2017   Anxiety disorder 08/17/2017   Tobacco use disorder 08/17/2017     Referrals to Alternative Service(s): Referred to Alternative Service(s):   Place:   Date:   Time:    Referred to Alternative Service(s):   Place:   Date:   Time:    Referred to Alternative Service(s):   Place:   Date:   Time:    Referred to Alternative Service(s):   Place:   Date:   Time:     Angellica Maddison T, Counselor

## 2023-02-07 MED ORDER — CYCLOBENZAPRINE HCL 10 MG PO TABS
5.0000 mg | ORAL_TABLET | Freq: Once | ORAL | Status: AC
  Administered 2023-02-07: 5 mg via ORAL
  Filled 2023-02-07: qty 1

## 2023-02-07 MED ORDER — ACETAMINOPHEN 325 MG PO TABS
650.0000 mg | ORAL_TABLET | Freq: Once | ORAL | Status: AC
  Administered 2023-02-07: 650 mg via ORAL
  Filled 2023-02-07: qty 2

## 2023-02-07 NOTE — ED Notes (Signed)
Pt c/o leg cramps as well as anxiety related to not knowing what is going on or where he is going. This RN told pt that I would notify the provider to see about a muscle relaxer. Explained to pt the entire process of how things flow when coming to the ED for behavioral health concerns and the process regarding finding pt placement for inpatient treatment. Pt reports decreased anxiety after explanation. Pt only scoring on the CIWA currently for anxiety.

## 2023-02-07 NOTE — ED Notes (Signed)
Sheriff called for transport to [facility]. States it will be this afternoon before pt can be picked up.

## 2023-02-07 NOTE — ED Notes (Signed)
Sheriff's officers arrived and transported pt to [facility]. Pt ambulatory w/ steady gait, calm, cooperative and VSS.

## 2023-02-07 NOTE — Progress Notes (Signed)
TOC consulted for substance abuse resources. Resources attached to AVS. Pt under TTS care at this time. No further TOC needs.

## 2023-02-07 NOTE — ED Notes (Signed)
Notified EDP that transport is on their way and need for EMTALA to be filled out

## 2023-02-07 NOTE — ED Notes (Signed)
Patient sleeping, no distress. Sitter in place. Larry Gardner at 0900. They called and I gave report, will pass onto day shift nurse.

## 2023-03-01 ENCOUNTER — Emergency Department (HOSPITAL_BASED_OUTPATIENT_CLINIC_OR_DEPARTMENT_OTHER)
Admission: EM | Admit: 2023-03-01 | Discharge: 2023-03-01 | Discharge status: Against Medical Advice | Payer: Medicaid Other | Attending: Emergency Medicine | Admitting: Emergency Medicine

## 2023-03-01 ENCOUNTER — Encounter (HOSPITAL_BASED_OUTPATIENT_CLINIC_OR_DEPARTMENT_OTHER): Payer: Self-pay | Admitting: Emergency Medicine

## 2023-03-01 DIAGNOSIS — Z5321 Procedure and treatment not carried out due to patient leaving prior to being seen by health care provider: Secondary | ICD-10-CM | POA: Diagnosis not present

## 2023-03-01 DIAGNOSIS — Z1152 Encounter for screening for COVID-19: Secondary | ICD-10-CM | POA: Diagnosis not present

## 2023-03-01 DIAGNOSIS — R059 Cough, unspecified: Secondary | ICD-10-CM | POA: Diagnosis not present

## 2023-03-01 DIAGNOSIS — J3489 Other specified disorders of nose and nasal sinuses: Secondary | ICD-10-CM | POA: Insufficient documentation

## 2023-03-01 DIAGNOSIS — R519 Headache, unspecified: Secondary | ICD-10-CM | POA: Insufficient documentation

## 2023-03-01 LAB — RESP PANEL BY RT-PCR (RSV, FLU A&B, COVID)  RVPGX2
Influenza A by PCR: NEGATIVE
Influenza B by PCR: NEGATIVE
Resp Syncytial Virus by PCR: NEGATIVE
SARS Coronavirus 2 by RT PCR: NEGATIVE

## 2023-03-01 NOTE — ED Triage Notes (Signed)
Headache, sinus pressure. cough Started Thursday.

## 2023-05-19 ENCOUNTER — Encounter (HOSPITAL_BASED_OUTPATIENT_CLINIC_OR_DEPARTMENT_OTHER): Payer: Self-pay | Admitting: Emergency Medicine

## 2023-05-19 ENCOUNTER — Emergency Department (HOSPITAL_BASED_OUTPATIENT_CLINIC_OR_DEPARTMENT_OTHER)
Admission: EM | Admit: 2023-05-19 | Discharge: 2023-05-19 | Disposition: A | Discharge status: Home / Self Care | Payer: MEDICAID | Attending: Emergency Medicine | Admitting: Emergency Medicine

## 2023-05-19 ENCOUNTER — Other Ambulatory Visit: Payer: Self-pay

## 2023-05-19 ENCOUNTER — Emergency Department (HOSPITAL_BASED_OUTPATIENT_CLINIC_OR_DEPARTMENT_OTHER): Payer: MEDICAID

## 2023-05-19 DIAGNOSIS — M79632 Pain in left forearm: Secondary | ICD-10-CM | POA: Insufficient documentation

## 2023-05-19 DIAGNOSIS — M25532 Pain in left wrist: Secondary | ICD-10-CM | POA: Insufficient documentation

## 2023-05-19 DIAGNOSIS — I10 Essential (primary) hypertension: Secondary | ICD-10-CM | POA: Diagnosis not present

## 2023-05-19 DIAGNOSIS — Z79899 Other long term (current) drug therapy: Secondary | ICD-10-CM | POA: Insufficient documentation

## 2023-05-19 DIAGNOSIS — X500XXA Overexertion from strenuous movement or load, initial encounter: Secondary | ICD-10-CM | POA: Diagnosis not present

## 2023-05-19 MED ORDER — HYDROCODONE-ACETAMINOPHEN 5-325 MG PO TABS
1.0000 | ORAL_TABLET | Freq: Once | ORAL | Status: AC
  Administered 2023-05-19: 1 via ORAL
  Filled 2023-05-19: qty 1

## 2023-05-19 NOTE — ED Triage Notes (Signed)
Left lower arm pain.  Experienced pain while lifting heavy items yesterday. Happened yesterday

## 2023-05-19 NOTE — ED Notes (Signed)
Ice pack provided, wrist brace placed on L wrist which pt states provided comfort along with decreased pain from PO med. Discharge instructions, pain management, and follow up care reviewed and explained. Pt verbalized understanding and had no further questions on d/c. Pt caox4, ambulatory, NAD on d/c.

## 2023-05-19 NOTE — Discharge Instructions (Addendum)
You were seen in the emergency room for left wrist pain.  Your imaging came back reassuring, with no sign of fracture.  Please wear wrist brace.  I would recommend alternating Tylenol and ibuprofen for pain control.  You can also ice over the area.  Please follow-up with [facility] if your symptoms continue.  I would recommend following up with a primary care doctor.    Return to emergency room with new or worsening symptoms.

## 2023-05-19 NOTE — ED Provider Notes (Signed)
[place] EMERGENCY DEPARTMENT AT [facility] Provider Note   CSN: [id number] Arrival date & time: 05/19/23  1241     History  Chief Complaint  Patient presents with   Arm Pain    Larry Gardner is a 52 y.o. male with past medical history of hypertension, anxiety presenting to emergency room with left-sided forearm pain that started yesterday.  Patient reports that he was lifting a heavy piece of wood yesterday, when he went decided on a experienced immediate distal forearm pain.  Since then he has had pain with supination and pronation and feels he is unable to fully extend at elbow.  Patient does not notice any obvious swelling, deformity.  He thinks that he tore something in his forearm. Denies fever, chills, open wounds.   Arm Pain       Home Medications Prior to Admission medications   Medication Sig Start Date End Date Taking? Authorizing Provider  DULoxetine (CYMBALTA) 30 MG capsule Take 1 capsule (30 mg total) by mouth daily. 08/23/22   Morene Crocker, MD  losartan-hydrochlorothiazide (HYZAAR) 50-12.5 MG tablet Take 1 tablet by mouth daily. 08/23/22 08/23/23  Morene Crocker, MD      Allergies    Patient has no known allergies.    Review of Systems   Review of Systems  Musculoskeletal:        Left forearm pain    Physical Exam Updated Vital Signs BP (!) 136/100   Pulse 96   Temp 98.3 F (36.8 C)   Resp 18   SpO2 95%  Physical Exam Vitals and nursing note reviewed.  Constitutional:      General: He is not in acute distress.    Appearance: He is not toxic-appearing.  HENT:     Head: Normocephalic and atraumatic.  Eyes:     General: No scleral icterus.    Conjunctiva/sclera: Conjunctivae normal.  Cardiovascular:     Rate and Rhythm: Normal rate and regular rhythm.     Pulses: Normal pulses.     Heart sounds: Normal heart sounds.  Pulmonary:     Effort: Pulmonary effort is normal. No respiratory distress.     Breath sounds:  Normal breath sounds.  Abdominal:     General: Abdomen is flat. Bowel sounds are normal.     Palpations: Abdomen is soft.     Tenderness: There is no abdominal tenderness.  Musculoskeletal:     Right lower leg: No edema.     Left lower leg: No edema.     Comments: Strong radial pulse bilaterally.  Forearm is soft.  No obvious deformity.  Bicep tendon intact.  Patient has normal shoulder range of motion, able to flex elbow, unable to fully extend elbow secondary to pain. Point tenderness over distal forearm, without obvious deformity.   Skin:    General: Skin is warm and dry.     Capillary Refill: Capillary refill takes less than 2 seconds.     Findings: No lesion.  Neurological:     General: No focal deficit present.     Mental Status: He is alert and oriented to person, place, and time. Mental status is at baseline.     Sensory: No sensory deficit.     Motor: No weakness.     ED Results / Procedures / Treatments   Labs (all labs ordered are listed, but only abnormal results are displayed) Labs Reviewed - No data to display  EKG None  Radiology No results found.  Procedures Procedures  Medications Ordered in ED Medications - No data to display  ED Course/ Medical Decision Making/ A&P                                 Medical Decision Making Amount and/or Complexity of Data Reviewed Radiology: ordered.  Risk Prescription drug management.   This patient presents to the ED for concern of left forearm pain, this involves an extensive number of treatment options, and is a complaint that carries with it a high risk of complications and morbidity.  The differential diagnosis includes strain, sprain, fracture, dislocation, compartment syndrome   Co morbidities that complicate the patient evaluation  Anxiety  HTN   Additional history obtained:  None      Imaging Studies ordered:  I ordered imaging studies including left forearm and elbow   I  independently visualized and interpreted imaging which showed no acute pathology no fracture I agree with the radiologist interpretation    Consultations Obtained:  None    Problem List / ED Course / Critical interventions / Medication management  Patient reporting with left distal forearm pain after heavy lifting yesterday.  Overall exam is reassuring patient is able to initiate movement and has good strength in forearm and hand.  Neurovascularly intact including strong radial pulse bilaterally.  X-rays were negative for any acute pathology.  Patient is had improvement in pain.  Compartments are soft, no concern for compartment syndrome at this time.  Recommend wrist brace which was applied here in the emergency room prior to discharge.  Patient is stable for discharge and understands plan. I ordered medication including Norco  for pain  Reevaluation of the patient after these medicines showed that the patient improved I have reviewed the patients home medicines and have made adjustments as needed   Plan  F/u w/ Emergeortho, wrist brace, ice, Tylneol and ibuprofen for pain control  Patient was given return precautions. Patient stable for discharge at this time.  Patient educated on sx/dx and verbalized understanding of plan. Return to ER w/ new or worsening sx.          Final Clinical Impression(s) / ED Diagnoses Final diagnoses:  Left wrist pain    Rx / DC Orders ED Discharge Orders     None         Smitty Knudsen, PA-C 05/19/23 1744    Terald Sleeper, MD 05/20/23 [phone]

## 2023-05-31 ENCOUNTER — Emergency Department (HOSPITAL_BASED_OUTPATIENT_CLINIC_OR_DEPARTMENT_OTHER)
Admission: EM | Admit: 2023-05-31 | Discharge: 2023-06-01 | Disposition: A | Discharge status: Home / Self Care | Payer: MEDICAID | Attending: Emergency Medicine | Admitting: Emergency Medicine

## 2023-05-31 ENCOUNTER — Other Ambulatory Visit: Payer: Self-pay

## 2023-05-31 ENCOUNTER — Emergency Department (HOSPITAL_BASED_OUTPATIENT_CLINIC_OR_DEPARTMENT_OTHER): Payer: MEDICAID

## 2023-05-31 DIAGNOSIS — F332 Major depressive disorder, recurrent severe without psychotic features: Secondary | ICD-10-CM | POA: Insufficient documentation

## 2023-05-31 DIAGNOSIS — F1020 Alcohol dependence, uncomplicated: Secondary | ICD-10-CM | POA: Diagnosis not present

## 2023-05-31 DIAGNOSIS — F10920 Alcohol use, unspecified with intoxication, uncomplicated: Secondary | ICD-10-CM

## 2023-05-31 DIAGNOSIS — I10 Essential (primary) hypertension: Secondary | ICD-10-CM | POA: Insufficient documentation

## 2023-05-31 DIAGNOSIS — R519 Headache, unspecified: Secondary | ICD-10-CM | POA: Insufficient documentation

## 2023-05-31 DIAGNOSIS — R45851 Suicidal ideations: Secondary | ICD-10-CM | POA: Insufficient documentation

## 2023-05-31 LAB — CBC
HCT: 44.3 % (ref 39.0–52.0)
Hemoglobin: 15.5 g/dL (ref 13.0–17.0)
MCH: 33.3 pg (ref 26.0–34.0)
MCHC: 35 g/dL (ref 30.0–36.0)
MCV: 95.1 fL (ref 80.0–100.0)
Platelets: 192 10*3/uL (ref 150–400)
RBC: 4.66 MIL/uL (ref 4.22–5.81)
RDW: 12.7 % (ref 11.5–15.5)
WBC: 7.1 10*3/uL (ref 4.0–10.5)
nRBC: 0 % (ref 0.0–0.2)

## 2023-05-31 LAB — RAPID URINE DRUG SCREEN, HOSP PERFORMED
Amphetamines: NOT DETECTED
Barbiturates: NOT DETECTED
Benzodiazepines: NOT DETECTED
Cocaine: NOT DETECTED
Opiates: NOT DETECTED
Tetrahydrocannabinol: NOT DETECTED

## 2023-05-31 NOTE — ED Triage Notes (Signed)
Pt bib [employer], per pt [employer] dropped him off and front door and left. Pt reports mental health issues and wants help.

## 2023-05-31 NOTE — ED Notes (Signed)
Pt dressed out in burgundy scrubs, belongings put behind nurses station and pt wanded by security.
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
## 2023-06-01 LAB — COMPREHENSIVE METABOLIC PANEL
ALT: 28 U/L (ref 0–44)
AST: 36 U/L (ref 15–41)
Albumin: 4.7 g/dL (ref 3.5–5.0)
Alkaline Phosphatase: 82 U/L (ref 38–126)
Anion gap: 10 (ref 5–15)
BUN: 10 mg/dL (ref 6–20)
CO2: 28 mmol/L (ref 22–32)
Calcium: 9.2 mg/dL (ref 8.9–10.3)
Chloride: 102 mmol/L (ref 98–111)
Creatinine, Ser: 0.78 mg/dL (ref 0.61–1.24)
GFR, Estimated: >60 mL/min (ref >60)
Glucose, Bld: 136 mg/dL — ABNORMAL HIGH (ref 70–99)
Potassium: 3.8 mmol/L (ref 3.5–5.1)
Sodium: 140 mmol/L (ref 135–145)
Total Bilirubin: 0.4 mg/dL (ref 0.3–1.2)
Total Protein: 8 g/dL (ref 6.5–8.1)

## 2023-06-01 LAB — ACETAMINOPHEN LEVEL: Acetaminophen (Tylenol), Serum: <10 ug/mL — ABNORMAL LOW (ref 10–30)

## 2023-06-01 LAB — SALICYLATE LEVEL: Salicylate Lvl: <7 mg/dL — ABNORMAL LOW (ref 7.0–30.0)

## 2023-06-01 LAB — ETHANOL: Alcohol, Ethyl (B): 348 mg/dL (ref <10)

## 2023-06-01 MED ORDER — LORAZEPAM 1 MG PO TABS
1.0000 mg | ORAL_TABLET | Freq: Once | ORAL | Status: AC
  Administered 2023-06-01: 1 mg via ORAL
  Filled 2023-06-01: qty 1

## 2023-06-01 MED ORDER — LORAZEPAM 1 MG PO TABS: 1.0000 mg | ORAL_TABLET | Freq: Four times a day (QID) | ORAL | Status: DC | PRN

## 2023-06-01 MED ORDER — LOPERAMIDE HCL 2 MG PO CAPS: 2.0000 mg | ORAL_CAPSULE | ORAL | Status: DC | PRN

## 2023-06-01 MED ORDER — THIAMINE HCL 100 MG/ML IJ SOLN
100.0000 mg | Freq: Once | INTRAMUSCULAR | Status: AC
  Administered 2023-06-01: 100 mg via INTRAMUSCULAR
  Filled 2023-06-01: qty 2

## 2023-06-01 MED ORDER — THIAMINE MONONITRATE 100 MG PO TABS: 100.0000 mg | ORAL_TABLET | Freq: Every day | ORAL | Status: DC

## 2023-06-01 MED ORDER — ADULT MULTIVITAMIN W/MINERALS CH
1.0000 | ORAL_TABLET | Freq: Every day | ORAL | Status: DC
  Administered 2023-06-01: 1 via ORAL
  Filled 2023-06-01: qty 1

## 2023-06-01 MED ORDER — HYDROXYZINE HCL 25 MG PO TABS
25.0000 mg | ORAL_TABLET | Freq: Four times a day (QID) | ORAL | Status: DC | PRN
  Administered 2023-06-01: 25 mg via ORAL
  Filled 2023-06-01: qty 1

## 2023-06-01 MED ORDER — ONDANSETRON 4 MG PO TBDP: 4.0000 mg | ORAL_TABLET | Freq: Four times a day (QID) | ORAL | Status: DC | PRN

## 2023-06-01 NOTE — ED Notes (Signed)
IVC paperwork has been submitted into the eFile, magistrate sent their copy. Waiting on [place] for [place] transport

## 2023-06-01 NOTE — ED Notes (Signed)
Messages left with [facility]. [facility] non-emergent called as well. Transport unlikely until morning.

## 2023-06-01 NOTE — ED Notes (Signed)
Dr. Elayne Snare to room speaking with patient

## 2023-06-01 NOTE — ED Notes (Signed)
Spoke with [employer]. Watch commander being called for permission to transport. Will call back.

## 2023-06-01 NOTE — ED Notes (Signed)
Patient states that he does not want to go to inpatient psych at this time. Requesting to be discharged. Dr. Elayne Snare updated

## 2023-06-01 NOTE — ED Notes (Signed)
Pt is with [employer] officer [place] and officer from facility. Pt is being transferred to another facility at this time. Vitals were taken at time of discharge.

## 2023-06-01 NOTE — ED Notes (Signed)
Attempted report x1. Nurse unavailable  

## 2023-06-01 NOTE — ED Notes (Signed)
Patient asleep.

## 2023-06-01 NOTE — ED Notes (Signed)
Patient got up from chair in room, ran past this tech and ran toward the door to lobby.  Security guard redirected patient back to room without incident.  Patient resting in room in chair with TV on.  Patient calm and cooperative.

## 2023-06-01 NOTE — ED Notes (Signed)
Sitter at the bedside at this time.

## 2023-06-01 NOTE — ED Notes (Signed)
MD at bedside. 

## 2023-06-01 NOTE — ED Provider Notes (Signed)
Emergency Medicine Observation Re-evaluation Note  Larry Gardner is a 52 y.o. male, seen on rounds today.  Pt initially presented to the ED for complaints of Suicidal ideation and alcohol use disorder.Mental Health Problem Currently, the patient is sleeping comfortably.   Physical Exam  BP 117/67   Pulse 85   Temp 98.2 F (36.8 C) (Oral)   Resp 20   SpO2 97%  Physical Exam General: Awake. Alert. No acute distress Cardiac: Regular rate rhythm Lungs: Clear to auscultation bilaterally Psych: Calm and cooperative  ED Course / MDM  EKG:   I have reviewed the labs performed to date as well as medications administered while in observation.  Recent changes in the last 24 hours include initial presentation to the emergency department acutely intoxicated under the influence of alcohol.  Endorsing SI.  Patient is amenable to psychiatric evaluation.  1140: Patient has been evaluated by TTS team who recommends inpatient psychiatric admission given active SI and worsening depression.  He was at first amenable to this plan and has a bed available at [facility], however now he wishes to go home.  Will place under involuntary commitment and transport to [facility].  1346: Slight tongue fasciculations on exam concerning for alcohol withdrawal.  Gave another dose of Ativan.  Patient voices discontent with planned for IVC and transferred to [facility] however he has continued to make suicidal statements and this is necessary to ensure his safety.  Will arrange for transport  Plan  Current plan is for continued boarding in the ED under IVC awaiting transport to [facility]  Final diagnosis Suicidal ideation Alcohol intoxication    [name], DO 06/01/23 1508

## 2023-06-01 NOTE — ED Notes (Signed)
Report given to [facility] L[facility], Tessie Eke at this time.

## 2023-06-01 NOTE — Progress Notes (Signed)
LCSW Progress Note  [id number]   Larry Gardner  06/01/2023  9:43 AM  Description:   Inpatient Psychiatric Referral  Patient was recommended inpatient per [facility] There are no available beds at [facility], per [facility].  Patient was referred to the following out of   network facilities:  [facility]  -- [address] [place] [address] [phone]  [facility]  -- [address] [place] [address] [phone]  [facility] [address]  -- [address], [place] [address] [phone]  [facility]  -- [address] [place], [place] [address] [phone]  [facility]  -- [address] [place] [address] [phone]  [facility]  -- [address], [place] [address] [phone]  [facility]  -- [address], [place] [address] [phone]  [facility]  -- [address], [place] [address] [phone]  [facility]  -- [address], [place] [address] [phone]  [facility]  -- [address] [place] [address] [phone]  [facility]  -- [address] [name] [place] [phone]  [facility]  -- [address], [place] [address] [phone]  [facility]  -- [address], [place] [address] [phone]  [facility]  -- [address], [place] [address] [phone]  [facility]  -- [address] [place] [address] [phone]  [facility]  -- [address], [place] [address] [phone]  [facility] EFAX  -- [place] [phone]  [facility]  -- [facility], [place] [phone]      Situation ongoing, CSW to continue following and update chart as more information becomes available.      [place] Luz Mares LCSW-A  06/01/2023 9:43 AM

## 2023-06-01 NOTE — ED Notes (Signed)
Pt. Speaking to counselor on TTS
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
## 2023-06-01 NOTE — Progress Notes (Signed)
LCSW Progress Note  [id number]   [name]  06/01/2023  9:46 AM  Description:   Inpatient Psychiatric Referral  Patient was recommended inpatient per [facility] There are no available beds at [facility], per [facility].  Patient was referred to the following out of   network facilities:  [facility]  -- [address], [place] [address] [phone]  [facility]  -- [address] [place] [address] [phone]  [facility] [address]  -- [address], [place] [address] [phone]  [facility]  -- [address] [place], [place] [address] [phone]  [facility]  -- [address] [place] [address] [phone]  [facility]  -- [address] [place] [address] [phone]  [facility]  -- [address], [place] [address] [phone]  [facility]  -- [address], [place] [address] [phone]  [facility]  -- [address], [place] [address] [phone]  [facility]  -- [address] [place] [address] [phone]  [facility]  -- [address] [name] [place] [phone]  [facility]  -- [address] [place] [address] [phone]  [facility]  -- [address], [place] [address] [phone]  [facility]  -- [address] [place] [address] [phone]  [facility]  -- [address] [place] [address] [phone]  [facility]  -- [address], [place] [address] [phone]  [facility] In[facility] EFAX  -- [place] [phone]  [facility]  -- [facility], [place] [phone]      Situation ongoing, CSW to continue following and update chart as more information becomes available.      [place] Lyla Jasek LCSW-A  06/01/2023 9:46 AM

## 2023-06-01 NOTE — ED Notes (Signed)
Pt. Resting since receiving lorazepam.

## 2023-06-01 NOTE — Progress Notes (Signed)
Pt has been accepted to [facility] on 06/01/2023  Bed assignment: 2 West   Pt meets inpatient criteria per Cecilio Asper NP   Attending Physician will be Dr. Forrestine Him MD   Report can be called to: [phone]   Pt can arrive anytime    Care Team Notified: [facility] RN, Arelia Sneddon RN, Fransico Michael RN, Bethany Hendra LCSW   [place] Matisha Termine LCSW-A   06/01/2023 10:09 AM

## 2023-06-01 NOTE — ED Notes (Signed)
Date and time results received: 06/01/23 0037 (use smartphrase ".now" to insert current time)  Test: etoh Critical Value: 348  Name of Provider Notified: Brandon Melnick, MD  Orders Received? Or Actions Taken?:  n/a

## 2023-06-01 NOTE — ED Notes (Signed)
Called [facility] at this time to update them on patient's transport.

## 2023-06-23 ENCOUNTER — Emergency Department (HOSPITAL_BASED_OUTPATIENT_CLINIC_OR_DEPARTMENT_OTHER)
Admission: EM | Admit: 2023-06-23 | Discharge: 2023-06-24 | Disposition: A | Discharge status: Home / Self Care | Payer: MEDICAID | Attending: Emergency Medicine | Admitting: Emergency Medicine

## 2023-06-23 ENCOUNTER — Other Ambulatory Visit: Payer: Self-pay

## 2023-06-23 ENCOUNTER — Encounter (HOSPITAL_BASED_OUTPATIENT_CLINIC_OR_DEPARTMENT_OTHER): Payer: Self-pay

## 2023-06-23 DIAGNOSIS — F32A Depression, unspecified: Secondary | ICD-10-CM | POA: Insufficient documentation

## 2023-06-23 DIAGNOSIS — R45851 Suicidal ideations: Secondary | ICD-10-CM | POA: Insufficient documentation

## 2023-06-23 DIAGNOSIS — F10120 Alcohol abuse with intoxication, uncomplicated: Secondary | ICD-10-CM | POA: Insufficient documentation

## 2023-06-23 DIAGNOSIS — Y908 Blood alcohol level of 240 mg/100 ml or more: Secondary | ICD-10-CM | POA: Insufficient documentation

## 2023-06-23 DIAGNOSIS — F10920 Alcohol use, unspecified with intoxication, uncomplicated: Secondary | ICD-10-CM

## 2023-06-23 LAB — CBC WITH DIFFERENTIAL/PLATELET
Abs Immature Granulocytes: 0.03 10*3/uL (ref 0.00–0.07)
Basophils Absolute: 0 10*3/uL (ref 0.0–0.1)
Basophils Relative: 0 %
Eosinophils Absolute: 0.1 10*3/uL (ref 0.0–0.5)
Eosinophils Relative: 1 %
HCT: 40.6 % (ref 39.0–52.0)
Hemoglobin: 14.4 g/dL (ref 13.0–17.0)
Immature Granulocytes: 0 %
Lymphocytes Relative: 42 %
Lymphs Abs: 2.8 10*3/uL (ref 0.7–4.0)
MCH: 33 pg (ref 26.0–34.0)
MCHC: 35.5 g/dL (ref 30.0–36.0)
MCV: 93.1 fL (ref 80.0–100.0)
Monocytes Absolute: 0.4 10*3/uL (ref 0.1–1.0)
Monocytes Relative: 6 %
Neutro Abs: 3.3 10*3/uL (ref 1.7–7.7)
Neutrophils Relative %: 51 %
Platelets: 256 10*3/uL (ref 150–400)
RBC: 4.36 MIL/uL (ref 4.22–5.81)
RDW: 12 % (ref 11.5–15.5)
WBC: 6.7 10*3/uL (ref 4.0–10.5)
nRBC: 0 % (ref 0.0–0.2)

## 2023-06-23 LAB — COMPREHENSIVE METABOLIC PANEL
ALT: 23 U/L (ref 0–44)
AST: 25 U/L (ref 15–41)
Albumin: 4.3 g/dL (ref 3.5–5.0)
Alkaline Phosphatase: 72 U/L (ref 38–126)
Anion gap: 10 (ref 5–15)
BUN: 19 mg/dL (ref 6–20)
CO2: 23 mmol/L (ref 22–32)
Calcium: 8 mg/dL — ABNORMAL LOW (ref 8.9–10.3)
Chloride: 107 mmol/L (ref 98–111)
Creatinine, Ser: 0.79 mg/dL (ref 0.61–1.24)
GFR, Estimated: >60 mL/min (ref >60)
Glucose, Bld: 106 mg/dL — ABNORMAL HIGH (ref 70–99)
Potassium: 3.7 mmol/L (ref 3.5–5.1)
Sodium: 140 mmol/L (ref 135–145)
Total Bilirubin: 0.3 mg/dL (ref <1.2)
Total Protein: 6.9 g/dL (ref 6.5–8.1)

## 2023-06-23 LAB — RAPID URINE DRUG SCREEN, HOSP PERFORMED
Amphetamines: POSITIVE — AB
Barbiturates: NOT DETECTED
Benzodiazepines: NOT DETECTED
Cocaine: NOT DETECTED
Opiates: NOT DETECTED
Tetrahydrocannabinol: NOT DETECTED

## 2023-06-23 LAB — ETHANOL: Alcohol, Ethyl (B): 348 mg/dL (ref <10)

## 2023-06-23 NOTE — BH Assessment (Signed)
Carollee Herter, RN reports that pt is too somnolent for assessment. TTS will be notified when pt is awake and alert for an assessment.

## 2023-06-23 NOTE — ED Triage Notes (Addendum)
Patient states "Take a gun and shoot me". Patient crying in triage saying "I will shoot myself if I got a gun". Patient appears intoxicated. When asked if patient wanted to hurt anyone else, he stated "Nope I just want to jump off the edge of a cliff and die". Patient denies any recent ETOH and drug use. Patient denies auditory and visual hallucinations. Patient in NAD, airway intact.

## 2023-06-23 NOTE — ED Provider Notes (Signed)
Larry Gardner EMERGENCY DEPARTMENT AT [facility] Provider Note   CSN: [id number] Arrival date & time: 06/23/23  1836     History  Chief Complaint  Patient presents with   Suicidal    Larry Gardner is a 52 y.o. male.  Patient here with suicidal thoughts.  States that he would take a gun and shoot him.  He is tearful.  Denies any alcohol or drug use.  Denies any auditory visual hallucinations.  He does not want to hurt anybody else.  History of anxiety.  Denies any chest pain shortness of breath weakness numbness tingling.  Denies any trauma.  Denies any recent illness.  The history is provided by the patient.       Home Medications Prior to Admission medications   Medication Sig Start Date End Date Taking? Authorizing Provider  DULoxetine (CYMBALTA) 30 MG capsule Take 1 capsule (30 mg total) by mouth daily. 08/23/22   Morene Crocker, MD  losartan-hydrochlorothiazide (HYZAAR) 50-12.5 MG tablet Take 1 tablet by mouth daily. Patient not taking: Reported on 06/01/2023 08/23/22 08/23/23  Morene Crocker, MD  risperiDONE (RISPERDAL) 2 MG tablet Take 2 mg by mouth at bedtime. 04/12/23   [provider]      Allergies    Patient has no known allergies.    Review of Systems   Review of Systems  Physical Exam Updated Vital Signs BP (!) 151/91 (BP Location: Right Arm)   Pulse (!) 111   Temp 98.8 F (37.1 C) (Temporal)   Resp 20   SpO2 99%  Physical Exam Vitals and nursing note reviewed.  Constitutional:      General: He is not in acute distress.    Appearance: He is well-developed.  HENT:     Head: Normocephalic and atraumatic.  Eyes:     Conjunctiva/sclera: Conjunctivae normal.  Cardiovascular:     Rate and Rhythm: Normal rate and regular rhythm.     Heart sounds: No murmur heard. Pulmonary:     Effort: Pulmonary effort is normal. No respiratory distress.     Breath sounds: Normal breath sounds.  Abdominal:     Palpations: Abdomen is  soft.     Tenderness: There is no abdominal tenderness.  Musculoskeletal:        General: No swelling.     Cervical back: Neck supple.  Skin:    General: Skin is warm and dry.     Capillary Refill: Capillary refill takes less than 2 seconds.  Neurological:     Mental Status: He is alert.  Psychiatric:        Mood and Affect: Mood is depressed.        Thought Content: Thought content includes suicidal ideation. Thought content includes suicidal plan.     ED Results / Procedures / Treatments   Labs (all labs ordered are listed, but only abnormal results are displayed) Labs Reviewed  COMPREHENSIVE METABOLIC PANEL  ETHANOL  RAPID URINE DRUG SCREEN, HOSP PERFORMED  CBC WITH DIFFERENTIAL/PLATELET    EKG EKG Interpretation Date/Time:  Friday June 23 2023 19:48:41 EST Ventricular Rate:  104 PR Interval:  197 QRS Duration:  106 QT Interval:  358 QTC Calculation: 471 R Axis:   79  Text Interpretation: Sinus tachycardia Borderline prolonged PR interval Confirmed by Virgina Norfolk [phone]) on 06/23/2023 7:53:40 PM  Radiology No results found.  Procedures Procedures    Medications Ordered in ED Medications - No data to display  ED Course/ Medical Decision Making/ A&P  Medical Decision Making Amount and/or Complexity of Data Reviewed Labs: ordered.   Cristino Dia is here with SI.  Unremarkable vitals.  No fever.  Patient does seem to be intoxicated but he denies alcohol or drug use.  Patient is calm.  He has been on Jordan and other meds for bipolar he states is not working.  He is having suicidal thoughts.  He is voluntarily here.  Will get medical screening labs.  EKG shows sinus rhythm.  I reviewed interpreted EKG.  Labs are unremarkable.  Will have him evaluated by psychiatry.  He is medically cleared.  This chart was dictated using voice recognition software.  Despite best efforts to proofread,  errors can occur which can  change the documentation meaning.         Final Clinical Impression(s) / ED Diagnoses Final diagnoses:  Suicidal ideation    Rx / DC Orders ED Discharge Orders     None         Virgina Norfolk, DO 06/23/23 2001

## 2023-06-24 NOTE — ED Provider Notes (Signed)
  Physical Exam  BP (!) 151/91 (BP Location: Right Arm)   Pulse (!) 111   Temp 98.8 F (37.1 C) (Temporal)   Resp 20   SpO2 99%   Physical Exam Vitals and nursing note reviewed.  Constitutional:      Appearance: Normal appearance.  Pulmonary:     Effort: Pulmonary effort is normal.  Neurological:     General: No focal deficit present.     Mental Status: He is alert and oriented to person, place, and time.     Procedures  Procedures  ED Course / MDM    Medical Decision Making Amount and/or Complexity of Data Reviewed Labs: ordered.   Care assumed from Dr. Lockie Mola at shift change.  Patient initially presenting here complaining of suicidal ideation while intoxicated.  Patient is now sober, alert, and is adamantly denying any suicidality.  He is requesting to go home.  At this point, I see no indication for involuntary commitment given the fact that these statements were made while he was intoxicated.  Patient will be discharged at his request.  To return as needed for any problems.       Geoffery Lyons, MD 06/24/23 [phone]

## 2023-06-24 NOTE — Discharge Instructions (Signed)
Follow-up with outpatient counseling as needed.  Return to the emergency department if you experience any new and/or concerning issues.

## 2023-09-07 ENCOUNTER — Emergency Department (HOSPITAL_COMMUNITY)

## 2023-09-07 ENCOUNTER — Emergency Department (HOSPITAL_COMMUNITY)
Admission: EM | Admit: 2023-09-07 | Discharge: 2023-09-08 | Disposition: A | Discharge status: Home / Self Care | Attending: Emergency Medicine | Admitting: Emergency Medicine

## 2023-09-07 ENCOUNTER — Encounter (HOSPITAL_COMMUNITY): Payer: Self-pay

## 2023-09-07 ENCOUNTER — Other Ambulatory Visit: Payer: Self-pay

## 2023-09-07 DIAGNOSIS — R0789 Other chest pain: Secondary | ICD-10-CM | POA: Diagnosis present

## 2023-09-07 DIAGNOSIS — F17200 Nicotine dependence, unspecified, uncomplicated: Secondary | ICD-10-CM | POA: Insufficient documentation

## 2023-09-07 DIAGNOSIS — R739 Hyperglycemia, unspecified: Secondary | ICD-10-CM | POA: Insufficient documentation

## 2023-09-07 DIAGNOSIS — R109 Unspecified abdominal pain: Secondary | ICD-10-CM | POA: Insufficient documentation

## 2023-09-07 LAB — BASIC METABOLIC PANEL
Anion gap: 9 (ref 5–15)
BUN: 12 mg/dL (ref 6–20)
CO2: 23 mmol/L (ref 22–32)
Calcium: 9.1 mg/dL (ref 8.9–10.3)
Chloride: 99 mmol/L (ref 98–111)
Creatinine, Ser: 0.74 mg/dL (ref 0.61–1.24)
GFR, Estimated: >60 mL/min (ref >60)
Glucose, Bld: 115 mg/dL — ABNORMAL HIGH (ref 70–99)
Potassium: 3.8 mmol/L (ref 3.5–5.1)
Sodium: 131 mmol/L — ABNORMAL LOW (ref 135–145)

## 2023-09-07 LAB — CBC
HCT: 40.4 % (ref 39.0–52.0)
Hemoglobin: 13.9 g/dL (ref 13.0–17.0)
MCH: 33 pg (ref 26.0–34.0)
MCHC: 34.4 g/dL (ref 30.0–36.0)
MCV: 96 fL (ref 80.0–100.0)
Platelets: 262 10*3/uL (ref 150–400)
RBC: 4.21 MIL/uL — ABNORMAL LOW (ref 4.22–5.81)
RDW: 11.9 % (ref 11.5–15.5)
WBC: 8.6 10*3/uL (ref 4.0–10.5)
nRBC: 0 % (ref 0.0–0.2)

## 2023-09-07 LAB — TROPONIN I (HIGH SENSITIVITY): Troponin I (High Sensitivity): <2 ng/L (ref <18)

## 2023-09-07 MED ORDER — FENTANYL CITRATE PF 50 MCG/ML IJ SOSY
50.0000 ug | PREFILLED_SYRINGE | Freq: Once | INTRAMUSCULAR | Status: AC
  Administered 2023-09-07: 50 ug via INTRAVENOUS
  Filled 2023-09-07: qty 1

## 2023-09-07 MED ORDER — ONDANSETRON HCL 4 MG/2ML IJ SOLN
4.0000 mg | Freq: Once | INTRAMUSCULAR | Status: AC
  Administered 2023-09-07: 4 mg via INTRAVENOUS
  Filled 2023-09-07: qty 2

## 2023-09-07 NOTE — ED Triage Notes (Signed)
 Pt BIB [facility] with c/o mid sharp/stabbing CP that started yesterday evening that has gotten worse and more consistent. Denies n/v/d. Endorses SOB with onset of pain. Denies lightheadedness and dizziness.
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
## 2023-09-08 ENCOUNTER — Emergency Department (HOSPITAL_COMMUNITY)

## 2023-09-08 MED ORDER — FENTANYL CITRATE PF 50 MCG/ML IJ SOSY
50.0000 ug | PREFILLED_SYRINGE | Freq: Once | INTRAMUSCULAR | Status: AC
  Administered 2023-09-08: 50 ug via INTRAVENOUS
  Filled 2023-09-08: qty 1

## 2023-09-08 MED ORDER — NAPROXEN 375 MG PO TABS: 375.0000 mg | ORAL_TABLET | Freq: Two times a day (BID) | ORAL | 0 refills | Status: DC

## 2023-09-08 MED ORDER — ACETAMINOPHEN 500 MG PO TABS: 500.0000 mg | ORAL_TABLET | Freq: Four times a day (QID) | ORAL | 0 refills | Status: AC | PRN

## 2023-09-08 MED ORDER — IOHEXOL 300 MG/ML  SOLN
100.0000 mL | Freq: Once | INTRAMUSCULAR | Status: AC | PRN
  Administered 2023-09-08: 100 mL via INTRAVENOUS

## 2023-09-08 MED ORDER — MELOXICAM 15 MG PO TABS: 15.0000 mg | ORAL_TABLET | Freq: Every day | ORAL | 0 refills | Status: AC

## 2023-09-08 MED ORDER — ACETAMINOPHEN 500 MG PO TABS: 500.0000 mg | ORAL_TABLET | Freq: Four times a day (QID) | ORAL | 0 refills | Status: DC | PRN

## 2023-09-08 NOTE — Discharge Instructions (Signed)
You have been diagnosed by your caregiver as having chest wall pain. °SEEK IMMEDIATE MEDICAL ATTENTION IF: °You develop a fever.  °Your chest pains become severe or intolerable.  °You develop new, unexplained symptoms (problems).  °You develop shortness of breath, nausea, vomiting, sweating or feel light headed.  °You develop a new cough or you cough up blood. ° °

## 2024-03-05 ENCOUNTER — Ambulatory Visit (HOSPITAL_COMMUNITY)
Admission: EM | Admit: 2024-03-05 | Discharge: 2024-03-05 | Disposition: A | Discharge status: Home / Self Care | Payer: MEDICAID | Attending: Psychiatry | Admitting: Psychiatry

## 2024-03-05 DIAGNOSIS — I10 Essential (primary) hypertension: Secondary | ICD-10-CM | POA: Insufficient documentation

## 2024-03-05 DIAGNOSIS — F1420 Cocaine dependence, uncomplicated: Secondary | ICD-10-CM | POA: Insufficient documentation

## 2024-03-05 DIAGNOSIS — F1290 Cannabis use, unspecified, uncomplicated: Secondary | ICD-10-CM | POA: Insufficient documentation

## 2024-03-05 DIAGNOSIS — F332 Major depressive disorder, recurrent severe without psychotic features: Secondary | ICD-10-CM | POA: Insufficient documentation

## 2024-03-05 DIAGNOSIS — F1020 Alcohol dependence, uncomplicated: Secondary | ICD-10-CM | POA: Insufficient documentation

## 2024-03-05 DIAGNOSIS — Z652 Problems related to release from prison: Secondary | ICD-10-CM | POA: Insufficient documentation

## 2024-03-05 DIAGNOSIS — R000 Tachycardia, unspecified: Secondary | ICD-10-CM | POA: Insufficient documentation

## 2024-03-05 DIAGNOSIS — F17200 Nicotine dependence, unspecified, uncomplicated: Secondary | ICD-10-CM | POA: Insufficient documentation

## 2024-03-05 DIAGNOSIS — F1920 Other psychoactive substance dependence, uncomplicated: Secondary | ICD-10-CM

## 2024-03-05 DIAGNOSIS — Z79899 Other long term (current) drug therapy: Secondary | ICD-10-CM | POA: Insufficient documentation

## 2024-03-05 DIAGNOSIS — F419 Anxiety disorder, unspecified: Secondary | ICD-10-CM | POA: Insufficient documentation

## 2024-03-05 DIAGNOSIS — F4310 Post-traumatic stress disorder, unspecified: Secondary | ICD-10-CM | POA: Insufficient documentation

## 2024-03-05 DIAGNOSIS — R413 Other amnesia: Secondary | ICD-10-CM | POA: Insufficient documentation

## 2024-03-05 DIAGNOSIS — Z5900 Homelessness unspecified: Secondary | ICD-10-CM | POA: Insufficient documentation

## 2024-03-05 NOTE — Progress Notes (Signed)
 Larry Gardner to be discharged Home per NP order. An After Visit Summary was printed and given to the patient. Patient discharged home via private auto.  [name]  03/05/2024 9:24 AM
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
## 2024-03-05 NOTE — Discharge Instructions (Addendum)
 Please follow up with the resource discussed. You can visit the [facility] upstairs before 06:50AM, Monday through Friday, during walk-in hours, or call them to schedule an appointment. You can also contact [facility] to make an appointment.  Get help right away if: You have thoughts about hurting yourself or others. Get help right away if you feel like you may hurt yourself or others, or have thoughts about taking your own life. Go to your nearest emergency room or: Call 911. Call the National Suicide Prevention Lifeline at 339-044-3552 or 988 in the [place] This is open 24 hours a day. If you're a Veteran: Call 988 and press 1. This is open 24 hours a day. Text the [employer] at 562-629-0981. Summary Mental health is not just the absence of mental illness. It involves understanding your emotions and behaviors, and taking steps to manage them in a healthy way. If you have symptoms of mental or emotional distress, get help from family, friends, a health care provider, or a mental health professional. Practice good mental health behaviors such as stress management skills, self-calming skills, exercise, healthy sleeping and eating, and supportive relationships. This information is not intended to replace advice given to you by your health care provider. Make sure you discuss any questions you have with your health care provider.  Education provided on the fact that if experiencing worsening of psychiatry symptoms including suicidal ideations, homicidal ideations, or having auditory/visual hallucinations, etc, to call 911, 988, come back to this location, or go to the nearest ER. Pt verbalized understanding.
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
